# Patient Record
Sex: Female | Born: 1945 | Race: White | Hispanic: No | Marital: Married | Smoking: Never smoker
Health system: Southern US, Community
[De-identification: ages and names within clinical notes are randomized; demographics above are authoritative.]

## PROBLEM LIST (undated history)

## (undated) DIAGNOSIS — K635 Polyp of colon: Secondary | ICD-10-CM

## (undated) DIAGNOSIS — I8390 Asymptomatic varicose veins of unspecified lower extremity: Secondary | ICD-10-CM

## (undated) DIAGNOSIS — E785 Hyperlipidemia, unspecified: Secondary | ICD-10-CM

## (undated) DIAGNOSIS — I10 Essential (primary) hypertension: Secondary | ICD-10-CM

## (undated) DIAGNOSIS — N6009 Solitary cyst of unspecified breast: Secondary | ICD-10-CM

## (undated) DIAGNOSIS — M8580 Other specified disorders of bone density and structure, unspecified site: Secondary | ICD-10-CM

## (undated) DIAGNOSIS — S4290XA Fracture of unspecified shoulder girdle, part unspecified, initial encounter for closed fracture: Secondary | ICD-10-CM

## (undated) HISTORY — PX: COLONOSCOPY: SHX174

## (undated) HISTORY — PX: SKIN GRAFT: SHX250

## (undated) HISTORY — DX: Polyp of colon: K63.5

## (undated) HISTORY — DX: Asymptomatic varicose veins of unspecified lower extremity: I83.90

## (undated) HISTORY — PX: OOPHORECTOMY: SHX86

## (undated) HISTORY — DX: Fracture of unspecified shoulder girdle, part unspecified, initial encounter for closed fracture: S42.90XA

## (undated) HISTORY — DX: Solitary cyst of unspecified breast: N60.09

## (undated) HISTORY — DX: Hyperlipidemia, unspecified: E78.5

## (undated) HISTORY — PX: OTHER SURGICAL HISTORY: SHX169

## (undated) HISTORY — DX: Essential (primary) hypertension: I10

## (undated) HISTORY — DX: Other specified disorders of bone density and structure, unspecified site: M85.80

---

## 1998-03-08 ENCOUNTER — Emergency Department (HOSPITAL_COMMUNITY): Admission: EM | Admit: 1998-03-08 | Discharge: 1998-03-08 | Payer: Self-pay | Admitting: Emergency Medicine

## 1998-12-10 ENCOUNTER — Encounter: Payer: Self-pay | Admitting: Obstetrics and Gynecology

## 1998-12-10 ENCOUNTER — Ambulatory Visit (HOSPITAL_COMMUNITY): Admission: RE | Admit: 1998-12-10 | Discharge: 1998-12-10 | Payer: Self-pay | Admitting: Obstetrics and Gynecology

## 1998-12-22 ENCOUNTER — Ambulatory Visit (HOSPITAL_COMMUNITY): Admission: RE | Admit: 1998-12-22 | Discharge: 1998-12-22 | Payer: Self-pay | Admitting: Obstetrics and Gynecology

## 1999-08-18 ENCOUNTER — Other Ambulatory Visit: Admission: RE | Admit: 1999-08-18 | Discharge: 1999-08-18 | Payer: Self-pay | Admitting: Obstetrics and Gynecology

## 2000-01-19 ENCOUNTER — Ambulatory Visit (HOSPITAL_COMMUNITY): Admission: RE | Admit: 2000-01-19 | Discharge: 2000-01-19 | Payer: Self-pay | Admitting: Internal Medicine

## 2000-01-19 ENCOUNTER — Encounter: Payer: Self-pay | Admitting: Internal Medicine

## 2000-09-01 ENCOUNTER — Other Ambulatory Visit: Admission: RE | Admit: 2000-09-01 | Discharge: 2000-09-01 | Payer: Self-pay | Admitting: Obstetrics and Gynecology

## 2001-06-15 ENCOUNTER — Ambulatory Visit (HOSPITAL_COMMUNITY): Admission: RE | Admit: 2001-06-15 | Discharge: 2001-06-15 | Payer: Self-pay | Admitting: Obstetrics and Gynecology

## 2001-06-15 ENCOUNTER — Encounter: Payer: Self-pay | Admitting: Obstetrics and Gynecology

## 2001-09-04 ENCOUNTER — Other Ambulatory Visit: Admission: RE | Admit: 2001-09-04 | Discharge: 2001-09-04 | Payer: Self-pay | Admitting: Obstetrics and Gynecology

## 2003-01-11 ENCOUNTER — Encounter: Payer: Self-pay | Admitting: Internal Medicine

## 2003-01-11 ENCOUNTER — Ambulatory Visit (HOSPITAL_COMMUNITY): Admission: RE | Admit: 2003-01-11 | Discharge: 2003-01-11 | Payer: Self-pay | Admitting: Internal Medicine

## 2004-10-20 ENCOUNTER — Encounter: Admission: RE | Admit: 2004-10-20 | Discharge: 2004-10-20 | Payer: Self-pay | Admitting: Internal Medicine

## 2006-04-13 ENCOUNTER — Encounter: Admission: RE | Admit: 2006-04-13 | Discharge: 2006-04-13 | Payer: Self-pay | Admitting: Internal Medicine

## 2007-10-27 ENCOUNTER — Ambulatory Visit: Payer: Self-pay | Admitting: Internal Medicine

## 2010-08-06 HISTORY — PX: CHOLECYSTECTOMY: SHX55

## 2010-08-20 ENCOUNTER — Encounter (INDEPENDENT_AMBULATORY_CARE_PROVIDER_SITE_OTHER): Payer: Self-pay | Admitting: General Surgery

## 2010-08-20 ENCOUNTER — Observation Stay (HOSPITAL_COMMUNITY)
Admission: RE | Admit: 2010-08-20 | Discharge: 2010-08-22 | Payer: Self-pay | Source: Home / Self Care | Attending: General Surgery | Admitting: General Surgery

## 2010-09-26 ENCOUNTER — Encounter: Payer: Self-pay | Admitting: Internal Medicine

## 2010-09-27 ENCOUNTER — Encounter: Payer: Self-pay | Admitting: Internal Medicine

## 2010-10-12 NOTE — Discharge Summary (Signed)
  NAMECERENITY, GOSHORN NO.:  [id number]  MEDICAL RECORD NO.:  [id number]          PATIENT TYPE:  OBV  LOCATION:  5158                         FACILITY:  [facility]  PHYSICIAN:  Adolph Pollack, M.D.DATE OF BIRTH:  July 24, 1946  DATE OF ADMISSION:  08/20/2010 DATE OF DISCHARGE:  08/22/2010                              DISCHARGE SUMMARY   PRIMARY FINAL DISCHARGE DIAGNOSIS:  Acute cholecystitis.  PROCEDURE:  Laparoscopic cholecystectomy with intraoperative cholangiogram on August 20, 2010.  REASON FOR ADMISSION:  Ms. Lapre is a 65 year old female who had severe case of biliary colic and right upper quadrant pain on [place] the beach.  A hospital there evaluated her an performed and abdominal ultrasound demonstrating multiple gallstones but no evidence of acute cholecystitis.  White blood cell count and liver function tests within normal limits.  She improved somewhat and then presented for elective cholecystectomy.  HOSPITAL COURSE:  Elective cholecystectomy demonstrated acute and chronic inflammatory changes that were fairly significant.  She tolerated the procedure well and a drain was left in.  I kept her in the hospital and on intravenous Unasyn.  She was feeling better. JP drain had serous fluid, and she was able to be discharged to home on August 22, 2010.  DISPOSITION:  Discharged to home on August 22, 2010.  She was told how to care for the drain and was given dietary and activity instructions. She will return to the office on August 26, 2010, for drain removal and followup.  Medications that were given on discharge were Percocet and Augmentin.  She was in satisfactory condition.     Adolph Pollack, M.D.     Kari Baars  D:  10/06/2010  T:  10/07/2010  Job:  [id number]  Electronically Signed by Avel Peace M.D. on 10/12/2010 02:37:39 PM

## 2010-11-16 LAB — CBC
MCH: 27.5 pg (ref 26.0–34.0)
MCHC: 32.1 g/dL (ref 30.0–36.0)
Platelets: 223 10*3/uL (ref 150–400)
RBC: 4.36 MIL/uL (ref 3.87–5.11)
RDW: 13.4 % (ref 11.5–15.5)

## 2010-11-17 LAB — CBC
Platelets: 313 10*3/uL (ref 150–400)
RDW: 13.1 % (ref 11.5–15.5)
WBC: 5.3 10*3/uL (ref 4.0–10.5)

## 2010-11-17 LAB — SURGICAL PCR SCREEN
MRSA, PCR: NEGATIVE
Staphylococcus aureus: NEGATIVE

## 2011-01-20 ENCOUNTER — Other Ambulatory Visit: Payer: Self-pay | Admitting: Internal Medicine

## 2011-01-20 DIAGNOSIS — I872 Venous insufficiency (chronic) (peripheral): Secondary | ICD-10-CM

## 2011-02-03 ENCOUNTER — Encounter: Payer: Self-pay | Admitting: Vascular Surgery

## 2011-02-12 ENCOUNTER — Other Ambulatory Visit: Payer: Self-pay

## 2011-02-23 ENCOUNTER — Encounter (INDEPENDENT_AMBULATORY_CARE_PROVIDER_SITE_OTHER): Payer: 59 | Admitting: Vascular Surgery

## 2011-02-23 ENCOUNTER — Encounter (INDEPENDENT_AMBULATORY_CARE_PROVIDER_SITE_OTHER): Payer: 59

## 2011-02-23 DIAGNOSIS — I83893 Varicose veins of bilateral lower extremities with other complications: Secondary | ICD-10-CM

## 2011-02-23 DIAGNOSIS — R609 Edema, unspecified: Secondary | ICD-10-CM

## 2011-02-24 NOTE — Consult Note (Signed)
NEW PATIENT CONSULTATION  Tracy Hawkins, Tracy Hawkins                                       02/23/2011 ZOXWR#:[id number]  The patient presents today for evaluation of lower extremity venous hypertension.  She is a very pleasant, active 65 year old white female with progressive symptoms in both lower extremities.  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  She has bilateral lower extremity swelling.  She reports an aching sensation and occasionally has awakening from sleep related to the pain there, as well.  She does not have any history of DVT, hemorrhage or superficial thrombophlebitis.  She does elevate the legs when possible.  She has not worn compression garments.  PAST MEDICAL HISTORY:  Remarkable for very good health.  She does have a history of cholecystectomy in December 2011.  No other major medical difficulties.  SOCIAL HISTORY:  She is married with 2 children.  She works as a Advice worker.  She does not smoke or drink alcohol.  FAMILY HISTORY:  Negative for premature atherosclerotic disease.  REVIEW OF SYSTEMS:  Positive for weight gain.  Her height is 5 feet 7 inches tall. VASCULAR:  Positive for pain with prolonged standing and with occasionally lying. NEUROLOGIC:  Positive for occasional headaches. Otherwise, review of systems negative.  PHYSICAL EXAMINATION:  General:  Well-developed, well-nourished white female appearing her stated age in no acute stress.  Vital Signs:  Blood pressure 149/85, pulse 69, respirations 16.  HEENT:  Normal.  UPPER EXTREMITIES:  Her radial and dorsalis pedis pulse are 2+ bilaterally. MUSCULOSKELETAL:  Shows no major deformities or cyanosis.  NEUROLOGIC: No focal weakness or paresthesias.  SKIN:  Without ulcers or rashes. LOWER EXTREMITIES:  She does have marked varicosities in her medial calves bilaterally.  She  underwent noninvasive venous duplex in our office, and I have imaged her veins as well.  This shows gross reflux throughout her great saphenous veins bilaterally extending into the varicosities.  She has reflux only in the proximal common femoral vein laterally.  I discussed the significance of this with the patient.  I explained that it would be safe to leave these as they are.  She is having progressive symptoms related to this.  I did explain the options of staged bilateral laser ablation and stab phlebectomy for relief of her symptoms.  I have recommended we fit her with graduated compression stockings and have done this today and instructed her on the daily use of these.  We will see her again in 3 months to continue discussion and determine her level of improvement with conservative treatment.    Larina Earthly, M.D. Electronically Signed  TFE/MEDQ  D:  02/23/2011  T:  02/24/2011  Job:  [id number]  cc:   Geoffry Paradise, M.D.

## 2011-02-26 ENCOUNTER — Other Ambulatory Visit: Payer: Self-pay | Admitting: Internal Medicine

## 2011-03-08 NOTE — Procedures (Unsigned)
LOWER EXTREMITY VENOUS REFLUX EXAM  INDICATION:  Edema, varicose veins.  EXAM:  Using color-flow imaging and pulse Doppler spectral analysis, the bilateral common femoral, superficial femoral, popliteal, posterior tibial, greater and lesser saphenous veins are evaluated.  There is evidence suggesting deep venous insufficiency in the bilateral lower extremities.  The bilateral saphenofemoral junctions are not competent with reflux of >527milliseconds. The bilateral GSV's are not competent with reflux of >572milliseconds with the caliber as described below.  The bilateral proximal small saphenous veins demonstrate incompetency. The right small saphenous vein diameters range from 0.26 cm to 0.33 cm. The left small saphenous vein diameters range from 0.21 cm to 0.44 cm.  GSV Diameter (used if found to be incompetent only)                                           Right    Left Proximal Greater Saphenous Vein           1.11 cm  1.34 cm Proximal-to-mid-thigh                     0.92 cm  1.24 cm Mid thigh                                 0.85 cm  1.51 cm Mid-distal thigh                          cm       cm Distal thigh                              0.87 cm  0.79 cm Knee                                      0.79 cm  0.74 cm  IMPRESSION: 1. The bilateral great saphenous veins are not competent with reflux     >548milliseconds. 2. The bilateral great saphenous veins are not tortuous. 3. The deep venous systems of the bilateral lower extremities are not     competent with reflux of >568milliseconds. 4. The bilateral small saphenous veins are not competent with reflux     of >560milliseconds. 5. An incompetent perforator is identified in the left mid /distal     calf, measuring approximately 0.23 cm.     ___________________________________________ Larina Earthly, M.D.  SH/MEDQ  D:  02/23/2011  T:  02/23/2011  Job:  [id number]

## 2011-04-15 ENCOUNTER — Other Ambulatory Visit: Payer: Self-pay

## 2011-04-27 ENCOUNTER — Encounter: Payer: Self-pay | Admitting: Vascular Surgery

## 2011-04-29 ENCOUNTER — Encounter: Payer: Self-pay | Admitting: Vascular Surgery

## 2011-05-05 ENCOUNTER — Other Ambulatory Visit: Payer: Self-pay | Admitting: Dermatology

## 2011-05-06 ENCOUNTER — Ambulatory Visit
Admission: RE | Admit: 2011-05-06 | Discharge: 2011-05-06 | Disposition: A | Discharge status: Home / Self Care | Payer: 59 | Source: Ambulatory Visit | Attending: Internal Medicine | Admitting: Internal Medicine

## 2011-05-06 DIAGNOSIS — I872 Venous insufficiency (chronic) (peripheral): Secondary | ICD-10-CM

## 2011-05-25 ENCOUNTER — Encounter: Payer: Self-pay | Admitting: Vascular Surgery

## 2011-05-26 ENCOUNTER — Ambulatory Visit (INDEPENDENT_AMBULATORY_CARE_PROVIDER_SITE_OTHER): Payer: 59 | Admitting: Vascular Surgery

## 2011-05-26 ENCOUNTER — Encounter: Payer: Self-pay | Admitting: Vascular Surgery

## 2011-05-26 VITALS — BP 146/72 | HR 89 | Resp 18 | Ht 66.0 in | Wt 200.0 lb

## 2011-05-26 DIAGNOSIS — I83893 Varicose veins of bilateral lower extremities with other complications: Secondary | ICD-10-CM

## 2011-05-26 NOTE — Progress Notes (Signed)
Problems with Activities of Daily Living Secondary to Leg Pain  1. Mrs. Billinger states she has greatly reduced the length of time she walks on the treadmill for exercise due to leg pain.   2. Mrs. Millirons states that prolonged sitting especially on car trips is extremely painful due to leg pain.  Rankin, [name]   Failure of  Conservative Therapy:  1. Worn 20-30 mm Hg thigh high compression hose >3 months with no relief of symptoms.  2. Frequently elevates legs-no relief of symptoms  3. Taken Ibuprofen 600 Mg TID with no relief of symptoms.  The patient is a 14 discussion regarding severe venous hypertension and pain associated with bilateral calf vein varicosities. She has failed a 3 month trial of elevation ibuprofen and thigh graduated compression garments. He reports pain specifically over the varicosities in the medial calves and also a heavy achy sensation at prolonged standing and both lower extremities. She's not had any DVT or bleeding.  I did reimage her veins with site ultrasound sister gross reflux in her saphenous veins extending into the varicosities.  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 We will plan for staged bilateral laser ablation of her saphenous veins and staged bilateral stab phlebectomy at the same setting

## 2011-06-03 ENCOUNTER — Ambulatory Visit (AMBULATORY_SURGERY_CENTER): Payer: 59 | Admitting: *Deleted

## 2011-06-03 ENCOUNTER — Encounter: Payer: Self-pay | Admitting: Internal Medicine

## 2011-06-03 ENCOUNTER — Telehealth: Payer: Self-pay | Admitting: *Deleted

## 2011-06-03 VITALS — Ht 66.0 in | Wt 215.5 lb

## 2011-06-03 DIAGNOSIS — Z1211 Encounter for screening for malignant neoplasm of colon: Secondary | ICD-10-CM

## 2011-06-03 MED ORDER — PEG-KCL-NACL-NASULF-NA ASC-C 100 G PO SOLR: ORAL | Status: DC

## 2011-06-03 NOTE — Telephone Encounter (Signed)
Ok for recall now (she's only months away from 10 yrs). Thanks

## 2011-06-03 NOTE — Telephone Encounter (Signed)
Dr. Perry---pt had screening colonoscopy 2003. She had hyperplastic polyps.  No family hx of colon cancer or polyps.   She had laparoscopic cholecystectomy December 2011. She is here today in [place] for recall colonoscopy. She is having bloating, belching, and flatulence since that surgery.  No other GI symptoms.  Her 10 year recall would be August 2013.  Is she due for colon now or should she have recall colonoscopy Aug. 2013?  I have put her chart on your desk for review.  Thanks, Olegario Messier

## 2011-06-03 NOTE — Telephone Encounter (Signed)
Pt notified to keep colonoscopy appt as planned. Tracy Hawkins

## 2011-06-15 ENCOUNTER — Ambulatory Visit (AMBULATORY_SURGERY_CENTER): Payer: 59 | Admitting: Internal Medicine

## 2011-06-15 ENCOUNTER — Encounter: Payer: Self-pay | Admitting: Internal Medicine

## 2011-06-15 VITALS — BP 137/76 | HR 81 | Temp 98.4°F | Resp 14 | Ht 66.0 in | Wt 215.0 lb

## 2011-06-15 DIAGNOSIS — Z1211 Encounter for screening for malignant neoplasm of colon: Secondary | ICD-10-CM

## 2011-06-15 DIAGNOSIS — D126 Benign neoplasm of colon, unspecified: Secondary | ICD-10-CM

## 2011-06-15 DIAGNOSIS — Z86010 Personal history of colonic polyps: Secondary | ICD-10-CM

## 2011-06-15 MED ORDER — SODIUM CHLORIDE 0.9 % IV SOLN: 500.0000 mL | INTRAVENOUS | Status: DC

## 2011-06-15 NOTE — Progress Notes (Signed)
Report received from [facility] RN to relieve her for lunch

## 2011-06-15 NOTE — Patient Instructions (Signed)
Please review discharge instructions (blue and green sheets)  Await pathology  Resume your normal medications 

## 2011-06-16 ENCOUNTER — Telehealth: Payer: Self-pay

## 2011-06-16 NOTE — Telephone Encounter (Signed)

## 2011-07-02 ENCOUNTER — Other Ambulatory Visit: Payer: Self-pay | Admitting: Internal Medicine

## 2011-12-28 ENCOUNTER — Telehealth: Payer: Self-pay | Admitting: *Deleted

## 2011-12-28 NOTE — Telephone Encounter (Signed)
Returned Mrs. Mancinas's earlier phone message with questions regarding process for rescheduling laser ablation procedure she deferred in October 2012.  Scheduled Mrs. Durrell for a follow-up appointment with Dr. Arbie Cookey on 01-04-2012 at 10AM.  Mrs. Tatum states she has been wearing thigh high 20-30 mm HG compression stockings since her last visit with Dr. Arbie Cookey 05-26-2011 and is continuing to experience leg pain.  Explained to Mrs. Brod that following the follow up visit with Dr. Arbie Cookey on 01-04-2012 we would pre-cert for office surgery with her insurance company. Mrs. Meinecke verbalized understanding of the process.  Kyia Rhude, [name]

## 2012-01-03 ENCOUNTER — Encounter: Payer: Self-pay | Admitting: Vascular Surgery

## 2012-01-04 ENCOUNTER — Encounter: Payer: Self-pay | Admitting: Vascular Surgery

## 2012-01-04 ENCOUNTER — Ambulatory Visit (INDEPENDENT_AMBULATORY_CARE_PROVIDER_SITE_OTHER): Payer: 59 | Admitting: Vascular Surgery

## 2012-01-04 VITALS — BP 138/84 | HR 67 | Resp 18 | Ht 67.0 in | Wt 200.0 lb

## 2012-01-04 DIAGNOSIS — I83893 Varicose veins of bilateral lower extremities with other complications: Secondary | ICD-10-CM | POA: Insufficient documentation

## 2012-01-04 NOTE — Progress Notes (Signed)
Problems with Activities of Daily Living Secondary to Leg Pain  1. 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  2. Tracy Hawkins walks for exercise on treadmill and she has had to decrease the frequency of walking due to leg pain.  3. 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  Rankin, [name]   Failure of  Conservative Therapy:  1. Worn 20-30 mm Hg thigh high compression hose >3 months with no relief of symptoms.  2. Frequently elevates legs-no relief of symptoms  3. Taken Ibuprofen 600 Mg TID with no relief of symptoms.  Patient tends to have difficulty with venous hypertension in both legs. She continues to wear thigh high compression garments and continues to have pain despite this. She is quite active and has pain with sitting and with standing. His also makes it difficult for her to sleep at night. I reimaged her veins with ultrasound today. This reveals gross reflux in her great saphenous vein bilaterally extending into the large tributaries in her medial calves bilaterally. I have recommended staged bilateral laser ablation and stab phlebectomy for relief of her pain. I reviewed the procedure with her and she wished to proceed as soon as possible. We will schedule this at her convenience. She reports that the right leg is somewhat worse than the left so we will start with her right leg first

## 2012-01-10 ENCOUNTER — Other Ambulatory Visit: Payer: Self-pay | Admitting: *Deleted

## 2012-01-10 DIAGNOSIS — I83893 Varicose veins of bilateral lower extremities with other complications: Secondary | ICD-10-CM

## 2012-02-23 ENCOUNTER — Telehealth: Payer: Self-pay | Admitting: *Deleted

## 2012-02-23 NOTE — Telephone Encounter (Signed)
Informed Mrs. Wimer that since Dr. Bosie Helper schedule had changed and he would be in the office the week of July 22,2013 her post laser ablation duplex and follow up appointments would rescheduled to March 30, 2012 at 9:00AM for duplex and 10:15AM to see Dr. Arbie Cookey.  Sherell Christoffel, [name]

## 2012-03-22 ENCOUNTER — Encounter: Payer: Self-pay | Admitting: Vascular Surgery

## 2012-03-23 ENCOUNTER — Ambulatory Visit (INDEPENDENT_AMBULATORY_CARE_PROVIDER_SITE_OTHER): Payer: 59 | Admitting: Vascular Surgery

## 2012-03-23 ENCOUNTER — Encounter: Payer: Self-pay | Admitting: Vascular Surgery

## 2012-03-23 ENCOUNTER — Other Ambulatory Visit: Payer: 59 | Admitting: Vascular Surgery

## 2012-03-23 VITALS — BP 130/81 | HR 69 | Resp 18 | Ht 66.0 in | Wt 201.0 lb

## 2012-03-23 DIAGNOSIS — I83893 Varicose veins of bilateral lower extremities with other complications: Secondary | ICD-10-CM

## 2012-03-23 NOTE — Progress Notes (Signed)
Laser Ablation Procedure      Date: 03/23/2012    Tracy Hawkins DOB:06/26/1946  Consent signed: Yes  Surgeon:T.F. Early  Procedure: Laser Ablation: right Greater Saphenous Vein  BP 130/81  Pulse 69  Resp 18  Ht 5\' 6"  (1.676 m)  Wt 201 lb (91.173 kg)  BMI 32.44 kg/m2  Start time: 8:30AM   End time: 10:00AM  Tumescent Anesthesia: 475 cc 0.9% NaCl with 50 cc Lidocaine HCL with 1% Epi and 15 cc 8.4% NaHCO3  Local Anesthesia: 2 cc Lidocaine HCL and NaHCO3 (ratio 2:1)  Continuous Mode: 15 Watts Total Energy 2488 Joules Total Time2:45     Stab Phlebectomy: 10-20 incisions right calf Sites: Calf  Patient tolerated procedure well: Yes  Rankin, [name]  Description of Procedure:  After marking the course of the saphenous vein and the secondary varicosities in the standing position, the patient was placed on the operating table in the supine position, and the right leg was prepped and draped in sterile fashion. Local anesthetic was administered, and under ultrasound guidance the saphenous vein was accessed with a micro needle and guide wire; then the micro puncture sheath was placed. A guide wire was inserted to the saphenofemoral junction, followed by a 5 french sheath.  The position of the sheath and then the laser fiber below the junction was confirmed using the ultrasound and visualization of the aiming beam.  Tumescent anesthesia was administered along the course of the saphenous vein using ultrasound guidance. Protective laser glasses were placed on the patient, and the laser was fired at at 15 watt continuous mode.  For a total of 2488 joules.  A steri strip was applied to the puncture site.  The patient was then put into Trendelenburg position.  Local anesthetic was utilized overlying the marked varicosities.  Greater than 10-20 stab wounds were made using the tip of an 11 blade; and using the vein hook,  The phlebectomies were performed using a hemostat to avulse these  varicosities.  Adequate hemostasis was achieved, and steri strips were applied to the stab wound.      ABD pads and thigh high compression stockings were applied.  Ace wrap bandages were applied over the phlebectomy sites and at the top of the saphenofemoral junction.  Blood loss was less than 15 cc.  The patient ambulated out of the operating room having tolerated the procedure well.  The patient underwent uneventful right laser ablation from just below the knee to just below the saphenofemoral junction. She also had the multiple stabs in the calf. She had no immediate complication be seen again in one week

## 2012-03-24 ENCOUNTER — Encounter: Payer: Self-pay | Admitting: Vascular Surgery

## 2012-03-27 ENCOUNTER — Telehealth: Payer: Self-pay | Admitting: *Deleted

## 2012-03-27 NOTE — Telephone Encounter (Signed)
03/27/2012  Time: 9:14 AM   Patient Name: Tracy Hawkins  Patient of: T.F. Early  Procedure:Laser Ablation right greater saphenous vein and stab phlebectomy 10-20 incisions right leg  Reached patient at home and checked  Her status  Yes    Comments/Actions Taken: Mrs. Kuras states no problems with bleeding/oozing or swelling. She states mild discomfort in right inner thigh near groin which is relieved by Ibuprofen. Reviewed postprocedural instructions with her and reminded her of venous duplex and follow up appointment with Dr. Arbie Cookey on 03-30-2012.    Roshon Duell, [name] @SIGNATURE @

## 2012-03-28 ENCOUNTER — Ambulatory Visit: Payer: 59 | Admitting: Vascular Surgery

## 2012-03-29 ENCOUNTER — Encounter: Payer: Self-pay | Admitting: Vascular Surgery

## 2012-03-30 ENCOUNTER — Encounter (INDEPENDENT_AMBULATORY_CARE_PROVIDER_SITE_OTHER): Payer: 59 | Admitting: *Deleted

## 2012-03-30 ENCOUNTER — Other Ambulatory Visit: Payer: Self-pay | Admitting: *Deleted

## 2012-03-30 ENCOUNTER — Ambulatory Visit: Payer: 59 | Admitting: Vascular Surgery

## 2012-03-30 DIAGNOSIS — Z48812 Encounter for surgical aftercare following surgery on the circulatory system: Secondary | ICD-10-CM

## 2012-03-30 DIAGNOSIS — I83893 Varicose veins of bilateral lower extremities with other complications: Secondary | ICD-10-CM

## 2012-04-11 NOTE — Procedures (Unsigned)
DUPLEX DEEP VENOUS EXAM - LOWER EXTREMITY  INDICATION:  One-week follow-up, right great saphenous vein ablation.  HISTORY:  Edema:  No. Trauma/Surgery:  Ablation. Pain:  Tenderness. PE:  No. Previous DVT:  No. Anticoagulants:  No. Other:  DUPLEX EXAM:               CFV   SFV   PopV  PTV    GSV               R  L  R  L  R  L  R   L  R  L Thrombosis    0           0            + Spontaneous   +           +            0 Phasic Augmentation  +           +            0 Compressible  +           +            0 Competent  Legend:  + - yes  o - no  p - partial  D - decreased  IMPRESSION:  Successful ablation of right great saphenous vein approximately 1 cm distal to the junction through the insertion point without common femoral vein involvement.   _____________________________ Larina Earthly, M.D.  LT/MEDQ  D:  03/30/2012  T:  03/30/2012  Job:  [id number]

## 2012-04-19 IMAGING — RF DG CHOLANGIOGRAM OPERATIVE
1 series · 4 of 4 positions shown · non-contrast
Comparison: None.

CLINICAL DATA: Symptomatic cholelithiasis

INTRAOPERATIVE CHOLANGIOGRAM
TECHNIQUE: Cholangiographic images from the C-arm fluoroscopic
device were submitted for interpretation post-operatively.  Please
see the procedural report for the amount of contrast and the
fluoroscopy time utilized.

[Series 1: run · 4 of 30 frames shown]
[frame 1/30]
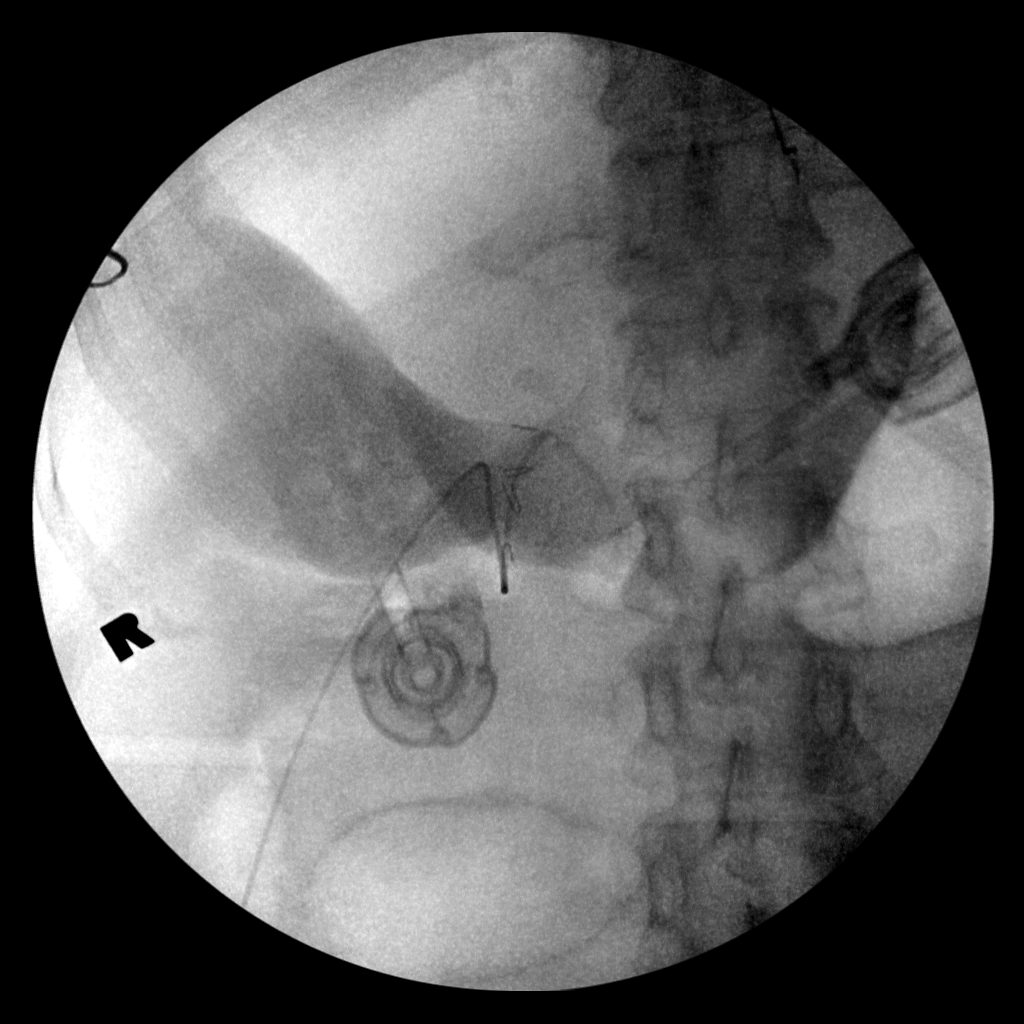
[frame 5/30]
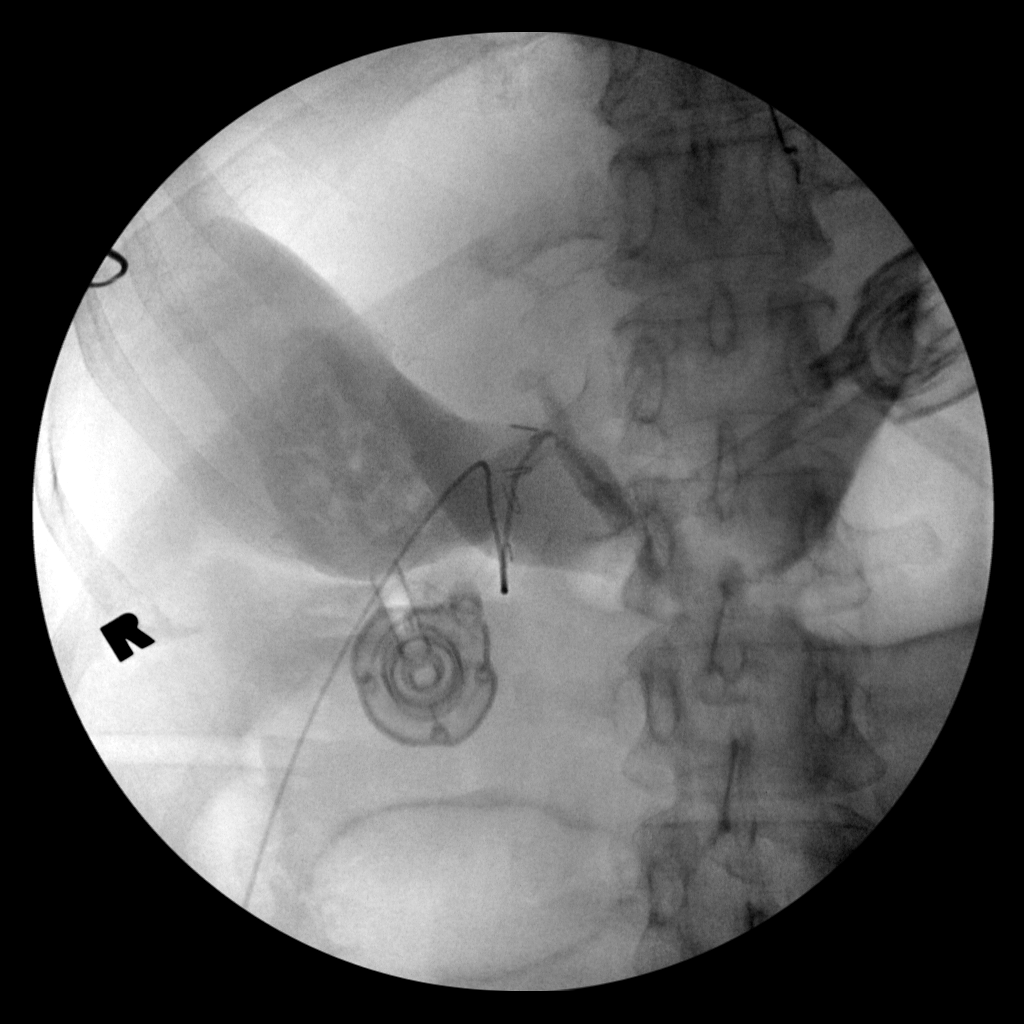
[frame 16/30]
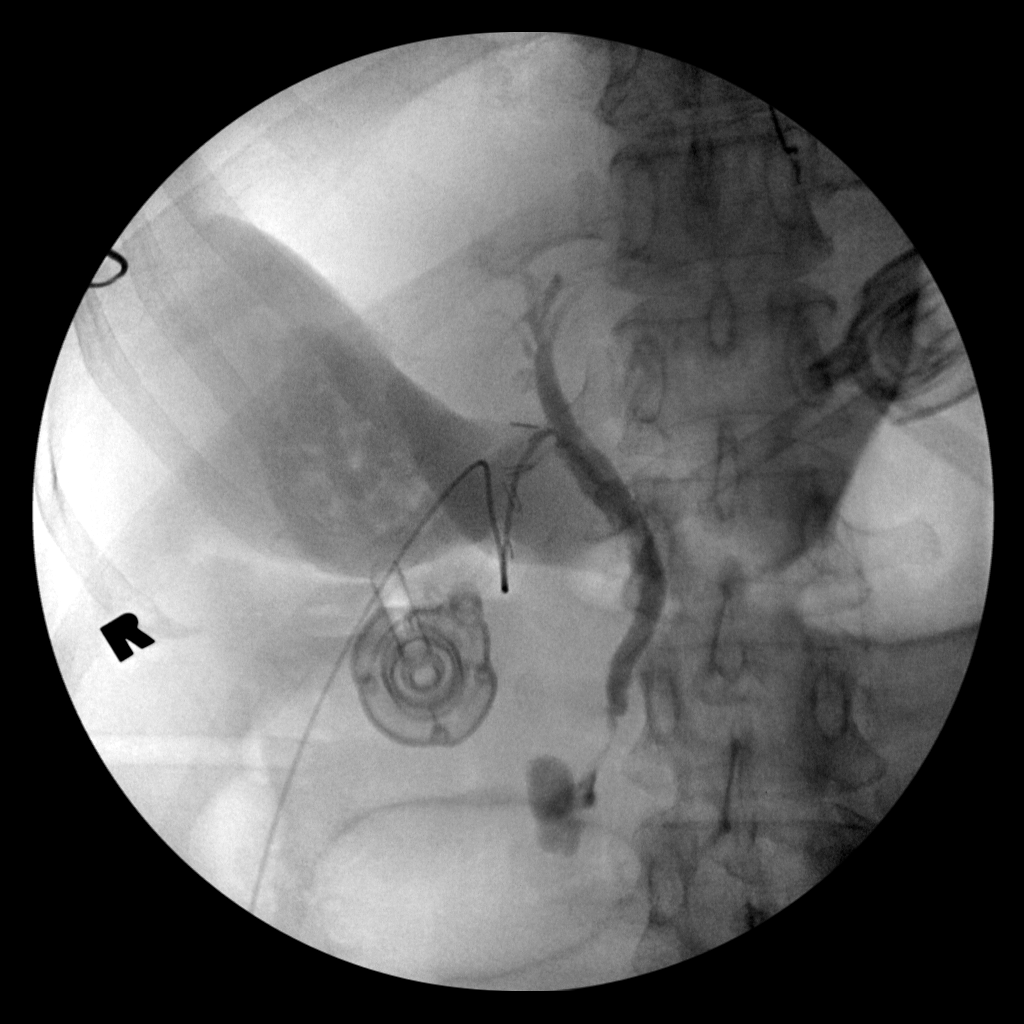
[frame 26/30]
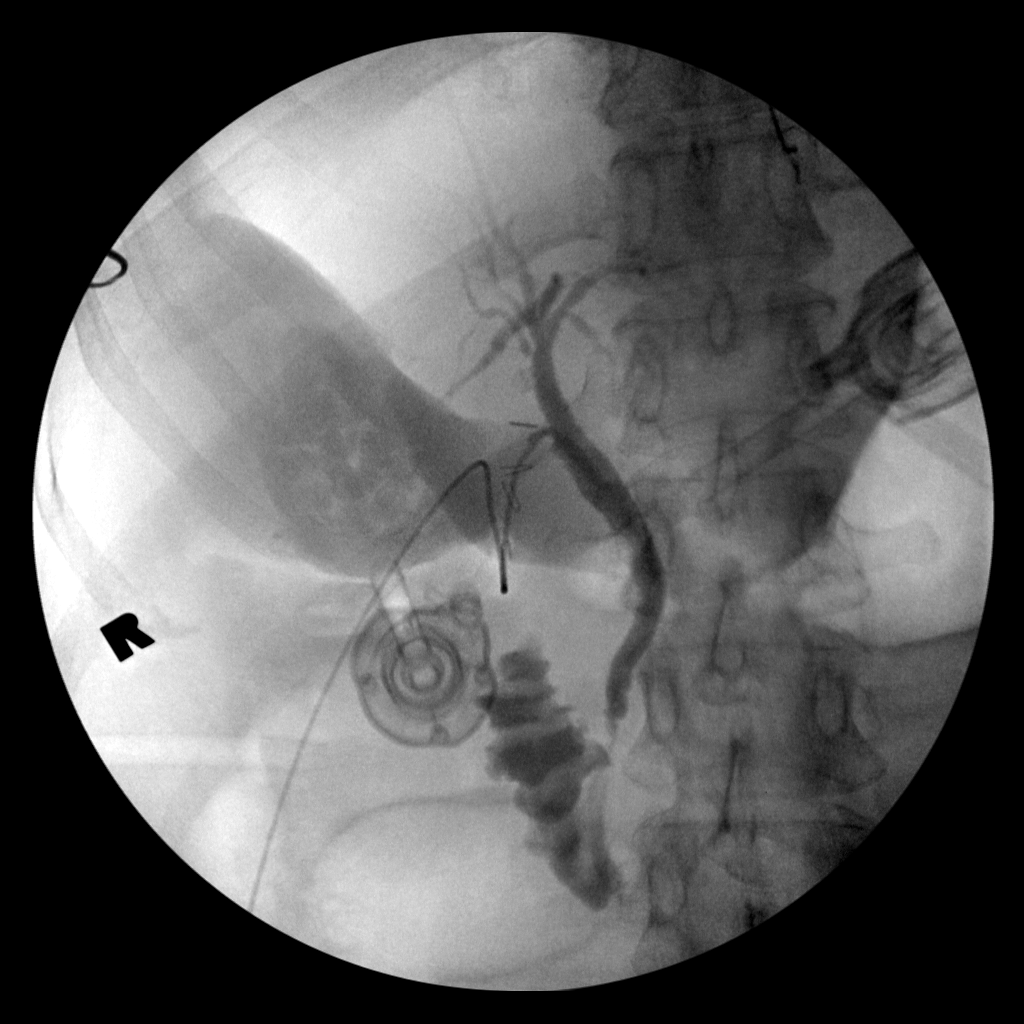

[4 of 4 positions shown; findings below may reference images not displayed]

FINDINGS: Initial image demonstrates cannulation of the cystic
duct.  Multiple gallstones seen within the gallbladder.  Injection
of contrast fills the common bile duct, common hepatic duct, and
central intrahepatic ducts.  There is no evidence of retained stone
in the duct.  Contrast flows into the duodenum.
IMPRESSION: No evidence of retained stones in the common bile duct.

## 2012-04-27 ENCOUNTER — Other Ambulatory Visit: Payer: 59 | Admitting: Vascular Surgery

## 2012-05-04 ENCOUNTER — Other Ambulatory Visit: Payer: 59 | Admitting: Vascular Surgery

## 2012-05-04 ENCOUNTER — Ambulatory Visit: Payer: 59 | Admitting: Vascular Surgery

## 2012-05-11 ENCOUNTER — Ambulatory Visit: Payer: 59 | Admitting: Vascular Surgery

## 2012-06-01 ENCOUNTER — Other Ambulatory Visit: Payer: 59 | Admitting: Vascular Surgery

## 2012-06-06 ENCOUNTER — Ambulatory Visit: Payer: 59 | Admitting: Vascular Surgery

## 2012-06-08 ENCOUNTER — Ambulatory Visit: Payer: 59 | Admitting: Vascular Surgery

## 2013-03-28 ENCOUNTER — Other Ambulatory Visit: Payer: Self-pay | Admitting: Dermatology

## 2014-02-20 DIAGNOSIS — H40019 Open angle with borderline findings, low risk, unspecified eye: Secondary | ICD-10-CM | POA: Diagnosis not present

## 2014-04-11 ENCOUNTER — Other Ambulatory Visit: Payer: Self-pay | Admitting: Dermatology

## 2014-04-11 DIAGNOSIS — L821 Other seborrheic keratosis: Secondary | ICD-10-CM | POA: Diagnosis not present

## 2014-04-11 DIAGNOSIS — L570 Actinic keratosis: Secondary | ICD-10-CM | POA: Diagnosis not present

## 2014-04-11 DIAGNOSIS — Z85828 Personal history of other malignant neoplasm of skin: Secondary | ICD-10-CM | POA: Diagnosis not present

## 2014-04-11 DIAGNOSIS — D1801 Hemangioma of skin and subcutaneous tissue: Secondary | ICD-10-CM | POA: Diagnosis not present

## 2014-04-11 DIAGNOSIS — D485 Neoplasm of uncertain behavior of skin: Secondary | ICD-10-CM | POA: Diagnosis not present

## 2014-04-11 DIAGNOSIS — L819 Disorder of pigmentation, unspecified: Secondary | ICD-10-CM | POA: Diagnosis not present

## 2014-04-11 DIAGNOSIS — D239 Other benign neoplasm of skin, unspecified: Secondary | ICD-10-CM | POA: Diagnosis not present

## 2014-04-12 ENCOUNTER — Encounter: Payer: Self-pay | Admitting: Internal Medicine

## 2014-04-23 DIAGNOSIS — J321 Chronic frontal sinusitis: Secondary | ICD-10-CM | POA: Diagnosis not present

## 2014-04-23 DIAGNOSIS — R05 Cough: Secondary | ICD-10-CM | POA: Diagnosis not present

## 2014-04-23 DIAGNOSIS — R059 Cough, unspecified: Secondary | ICD-10-CM | POA: Diagnosis not present

## 2014-04-23 DIAGNOSIS — I10 Essential (primary) hypertension: Secondary | ICD-10-CM | POA: Diagnosis not present

## 2014-04-23 DIAGNOSIS — Z6833 Body mass index (BMI) 33.0-33.9, adult: Secondary | ICD-10-CM | POA: Diagnosis not present

## 2014-06-05 DIAGNOSIS — H25019 Cortical age-related cataract, unspecified eye: Secondary | ICD-10-CM | POA: Diagnosis not present

## 2014-06-05 DIAGNOSIS — H40019 Open angle with borderline findings, low risk, unspecified eye: Secondary | ICD-10-CM | POA: Diagnosis not present

## 2014-06-05 DIAGNOSIS — H04129 Dry eye syndrome of unspecified lacrimal gland: Secondary | ICD-10-CM | POA: Diagnosis not present

## 2014-06-05 DIAGNOSIS — H2510 Age-related nuclear cataract, unspecified eye: Secondary | ICD-10-CM | POA: Diagnosis not present

## 2014-06-14 DIAGNOSIS — Z23 Encounter for immunization: Secondary | ICD-10-CM | POA: Diagnosis not present

## 2014-07-31 DIAGNOSIS — Z1231 Encounter for screening mammogram for malignant neoplasm of breast: Secondary | ICD-10-CM | POA: Diagnosis not present

## 2014-08-06 DIAGNOSIS — Z124 Encounter for screening for malignant neoplasm of cervix: Secondary | ICD-10-CM | POA: Diagnosis not present

## 2014-08-06 DIAGNOSIS — Z0000 Encounter for general adult medical examination without abnormal findings: Secondary | ICD-10-CM | POA: Diagnosis not present

## 2014-08-06 DIAGNOSIS — N841 Polyp of cervix uteri: Secondary | ICD-10-CM | POA: Diagnosis not present

## 2014-08-06 DIAGNOSIS — Z01419 Encounter for gynecological examination (general) (routine) without abnormal findings: Secondary | ICD-10-CM | POA: Diagnosis not present

## 2014-08-06 DIAGNOSIS — R87610 Atypical squamous cells of undetermined significance on cytologic smear of cervix (ASC-US): Secondary | ICD-10-CM | POA: Diagnosis not present

## 2015-04-07 DIAGNOSIS — H5711 Ocular pain, right eye: Secondary | ICD-10-CM | POA: Diagnosis not present

## 2015-04-07 DIAGNOSIS — I10 Essential (primary) hypertension: Secondary | ICD-10-CM | POA: Diagnosis not present

## 2015-06-06 DIAGNOSIS — D1801 Hemangioma of skin and subcutaneous tissue: Secondary | ICD-10-CM | POA: Diagnosis not present

## 2015-06-06 DIAGNOSIS — Z85828 Personal history of other malignant neoplasm of skin: Secondary | ICD-10-CM | POA: Diagnosis not present

## 2015-06-06 DIAGNOSIS — L821 Other seborrheic keratosis: Secondary | ICD-10-CM | POA: Diagnosis not present

## 2015-06-06 DIAGNOSIS — D2271 Melanocytic nevi of right lower limb, including hip: Secondary | ICD-10-CM | POA: Diagnosis not present

## 2015-06-06 DIAGNOSIS — D225 Melanocytic nevi of trunk: Secondary | ICD-10-CM | POA: Diagnosis not present

## 2015-06-06 DIAGNOSIS — L298 Other pruritus: Secondary | ICD-10-CM | POA: Diagnosis not present

## 2015-06-06 DIAGNOSIS — L814 Other melanin hyperpigmentation: Secondary | ICD-10-CM | POA: Diagnosis not present

## 2015-06-06 DIAGNOSIS — D2272 Melanocytic nevi of left lower limb, including hip: Secondary | ICD-10-CM | POA: Diagnosis not present

## 2015-06-06 DIAGNOSIS — L570 Actinic keratosis: Secondary | ICD-10-CM | POA: Diagnosis not present

## 2015-06-07 DIAGNOSIS — Z23 Encounter for immunization: Secondary | ICD-10-CM | POA: Diagnosis not present

## 2015-06-25 DIAGNOSIS — H25013 Cortical age-related cataract, bilateral: Secondary | ICD-10-CM | POA: Diagnosis not present

## 2015-06-25 DIAGNOSIS — H2513 Age-related nuclear cataract, bilateral: Secondary | ICD-10-CM | POA: Diagnosis not present

## 2015-06-25 DIAGNOSIS — H40013 Open angle with borderline findings, low risk, bilateral: Secondary | ICD-10-CM | POA: Diagnosis not present

## 2015-06-25 DIAGNOSIS — H04123 Dry eye syndrome of bilateral lacrimal glands: Secondary | ICD-10-CM | POA: Diagnosis not present

## 2015-07-24 ENCOUNTER — Ambulatory Visit (HOSPITAL_COMMUNITY)
Admission: RE | Admit: 2015-07-24 | Discharge: 2015-07-24 | Disposition: A | Discharge status: Home / Self Care | Payer: Medicare Other | Source: Ambulatory Visit | Attending: Internal Medicine | Admitting: Internal Medicine

## 2015-07-24 ENCOUNTER — Other Ambulatory Visit (HOSPITAL_COMMUNITY): Payer: Self-pay | Admitting: Internal Medicine

## 2015-07-24 DIAGNOSIS — M79662 Pain in left lower leg: Secondary | ICD-10-CM | POA: Diagnosis not present

## 2015-07-24 DIAGNOSIS — M859 Disorder of bone density and structure, unspecified: Secondary | ICD-10-CM | POA: Diagnosis not present

## 2015-07-24 DIAGNOSIS — M7989 Other specified soft tissue disorders: Secondary | ICD-10-CM | POA: Diagnosis not present

## 2015-07-24 DIAGNOSIS — I10 Essential (primary) hypertension: Secondary | ICD-10-CM | POA: Diagnosis not present

## 2015-07-24 DIAGNOSIS — M79605 Pain in left leg: Secondary | ICD-10-CM | POA: Diagnosis not present

## 2015-07-24 DIAGNOSIS — R52 Pain, unspecified: Secondary | ICD-10-CM

## 2015-07-24 DIAGNOSIS — E785 Hyperlipidemia, unspecified: Secondary | ICD-10-CM | POA: Diagnosis not present

## 2015-07-24 NOTE — Progress Notes (Signed)
Preliminary results by tech - Left Lower Ext. Venous Duplex Completed. Negative for deep and superficial vein thrombosis in the left lower extremity. Sigourney Portillo, BS, RDMS, RVT  

## 2015-08-07 DIAGNOSIS — Z1231 Encounter for screening mammogram for malignant neoplasm of breast: Secondary | ICD-10-CM | POA: Diagnosis not present

## 2015-08-11 DIAGNOSIS — Z01419 Encounter for gynecological examination (general) (routine) without abnormal findings: Secondary | ICD-10-CM | POA: Diagnosis not present

## 2015-08-11 DIAGNOSIS — Z1389 Encounter for screening for other disorder: Secondary | ICD-10-CM | POA: Diagnosis not present

## 2015-08-11 DIAGNOSIS — Z130 Encounter for screening for diseases of the blood and blood-forming organs and certain disorders involving the immune mechanism: Secondary | ICD-10-CM | POA: Diagnosis not present

## 2015-08-11 DIAGNOSIS — Z124 Encounter for screening for malignant neoplasm of cervix: Secondary | ICD-10-CM | POA: Diagnosis not present

## 2015-08-25 DIAGNOSIS — R922 Inconclusive mammogram: Secondary | ICD-10-CM | POA: Diagnosis not present

## 2015-08-25 DIAGNOSIS — R928 Other abnormal and inconclusive findings on diagnostic imaging of breast: Secondary | ICD-10-CM | POA: Diagnosis not present

## 2015-08-25 DIAGNOSIS — Z1231 Encounter for screening mammogram for malignant neoplasm of breast: Secondary | ICD-10-CM | POA: Diagnosis not present

## 2015-10-27 DIAGNOSIS — R05 Cough: Secondary | ICD-10-CM | POA: Diagnosis not present

## 2015-12-03 DIAGNOSIS — H04123 Dry eye syndrome of bilateral lacrimal glands: Secondary | ICD-10-CM | POA: Diagnosis not present

## 2015-12-03 DIAGNOSIS — H40013 Open angle with borderline findings, low risk, bilateral: Secondary | ICD-10-CM | POA: Diagnosis not present

## 2015-12-03 DIAGNOSIS — H1013 Acute atopic conjunctivitis, bilateral: Secondary | ICD-10-CM | POA: Diagnosis not present

## 2016-04-01 DIAGNOSIS — L298 Other pruritus: Secondary | ICD-10-CM | POA: Diagnosis not present

## 2016-04-01 DIAGNOSIS — Z85828 Personal history of other malignant neoplasm of skin: Secondary | ICD-10-CM | POA: Diagnosis not present

## 2016-05-11 DIAGNOSIS — I10 Essential (primary) hypertension: Secondary | ICD-10-CM | POA: Diagnosis not present

## 2016-05-11 DIAGNOSIS — T148 Other injury of unspecified body region: Secondary | ICD-10-CM | POA: Diagnosis not present

## 2016-06-16 ENCOUNTER — Encounter: Payer: Self-pay | Admitting: Internal Medicine

## 2016-06-19 DIAGNOSIS — Z23 Encounter for immunization: Secondary | ICD-10-CM | POA: Diagnosis not present

## 2016-06-23 ENCOUNTER — Encounter: Payer: Self-pay | Admitting: Internal Medicine

## 2016-07-01 DIAGNOSIS — H43393 Other vitreous opacities, bilateral: Secondary | ICD-10-CM | POA: Diagnosis not present

## 2016-07-01 DIAGNOSIS — H25013 Cortical age-related cataract, bilateral: Secondary | ICD-10-CM | POA: Diagnosis not present

## 2016-07-01 DIAGNOSIS — H40013 Open angle with borderline findings, low risk, bilateral: Secondary | ICD-10-CM | POA: Diagnosis not present

## 2016-07-01 DIAGNOSIS — H2513 Age-related nuclear cataract, bilateral: Secondary | ICD-10-CM | POA: Diagnosis not present

## 2016-07-15 ENCOUNTER — Ambulatory Visit: Payer: Medicare Other | Admitting: *Deleted

## 2016-07-15 VITALS — Ht 66.0 in | Wt 224.8 lb

## 2016-07-15 DIAGNOSIS — Z86010 Personal history of colonic polyps: Secondary | ICD-10-CM

## 2016-07-15 MED ORDER — NA SULFATE-K SULFATE-MG SULF 17.5-3.13-1.6 GM/177ML PO SOLN: 1.0000 | Freq: Once | ORAL | 0 refills | Status: AC

## 2016-07-15 NOTE — Progress Notes (Signed)
Denies allergies to eggs or soy products. Denies complications with sedation or anesthesia. Denies O2 use. Denies use of diet or weight loss medications.  Emmi instructions not given for colonoscopy, pt does not access email when not at work.

## 2016-07-19 DIAGNOSIS — L821 Other seborrheic keratosis: Secondary | ICD-10-CM | POA: Diagnosis not present

## 2016-07-19 DIAGNOSIS — D224 Melanocytic nevi of scalp and neck: Secondary | ICD-10-CM | POA: Diagnosis not present

## 2016-07-19 DIAGNOSIS — L814 Other melanin hyperpigmentation: Secondary | ICD-10-CM | POA: Diagnosis not present

## 2016-07-19 DIAGNOSIS — D2272 Melanocytic nevi of left lower limb, including hip: Secondary | ICD-10-CM | POA: Diagnosis not present

## 2016-07-19 DIAGNOSIS — D2271 Melanocytic nevi of right lower limb, including hip: Secondary | ICD-10-CM | POA: Diagnosis not present

## 2016-07-19 DIAGNOSIS — Z85828 Personal history of other malignant neoplasm of skin: Secondary | ICD-10-CM | POA: Diagnosis not present

## 2016-07-19 DIAGNOSIS — L918 Other hypertrophic disorders of the skin: Secondary | ICD-10-CM | POA: Diagnosis not present

## 2016-07-19 DIAGNOSIS — D2261 Melanocytic nevi of right upper limb, including shoulder: Secondary | ICD-10-CM | POA: Diagnosis not present

## 2016-07-19 DIAGNOSIS — D2262 Melanocytic nevi of left upper limb, including shoulder: Secondary | ICD-10-CM | POA: Diagnosis not present

## 2016-07-19 DIAGNOSIS — D225 Melanocytic nevi of trunk: Secondary | ICD-10-CM | POA: Diagnosis not present

## 2016-07-19 DIAGNOSIS — D1801 Hemangioma of skin and subcutaneous tissue: Secondary | ICD-10-CM | POA: Diagnosis not present

## 2016-07-27 ENCOUNTER — Ambulatory Visit (AMBULATORY_SURGERY_CENTER): Payer: Medicare Other | Admitting: Internal Medicine

## 2016-07-27 ENCOUNTER — Encounter: Payer: Self-pay | Admitting: Internal Medicine

## 2016-07-27 VITALS — BP 127/61 | HR 71 | Temp 98.0°F | Resp 12 | Ht 66.0 in | Wt 224.0 lb

## 2016-07-27 DIAGNOSIS — Z1211 Encounter for screening for malignant neoplasm of colon: Secondary | ICD-10-CM | POA: Diagnosis not present

## 2016-07-27 DIAGNOSIS — Z86010 Personal history of colonic polyps: Secondary | ICD-10-CM | POA: Diagnosis not present

## 2016-07-27 MED ORDER — SODIUM CHLORIDE 0.9 % IV SOLN: 500.0000 mL | INTRAVENOUS | Status: AC

## 2016-07-27 NOTE — Progress Notes (Signed)
No problems noted in the recovery room. maw 

## 2016-07-27 NOTE — Patient Instructions (Signed)
YOU HAD AN ENDOSCOPIC PROCEDURE TODAY AT [facility]:   Refer to the procedure report that was given to you for any specific questions about what was found during the examination.  If the procedure report does not answer your questions, please call your gastroenterologist to clarify.  If you requested that your care partner not be given the details of your procedure findings, then the procedure report has been included in a sealed envelope for you to review at your convenience later.  YOU SHOULD EXPECT: Some feelings of bloating in the abdomen. Passage of more gas than usual.  Walking can help get rid of the air that was put into your GI tract during the procedure and reduce the bloating. If you had a lower endoscopy (such as a colonoscopy or flexible sigmoidoscopy) you may notice spotting of blood in your stool or on the toilet paper. If you underwent a bowel prep for your procedure, you may not have a normal bowel movement for a few days.  Please Note:  You might notice some irritation and congestion in your nose or some drainage.  This is from the oxygen used during your procedure.  There is no need for concern and it should clear up in a day or so.  SYMPTOMS TO REPORT IMMEDIATELY:   Following lower endoscopy (colonoscopy or flexible sigmoidoscopy):  Excessive amounts of blood in the stool  Significant tenderness or worsening of abdominal pains  Swelling of the abdomen that is new, acute  Fever of 100F or higher   Following upper endoscopy (EGD)  Vomiting of blood or coffee ground material  New chest pain or pain under the shoulder blades  Painful or persistently difficult swallowing  New shortness of breath  Fever of 100F or higher  Black, tarry-looking stools  For urgent or emergent issues, a gastroenterologist can be reached at any hour by calling [phone].   DIET:  We do recommend a small meal at first, but then you may proceed to your regular diet.  Drink  plenty of fluids but you should avoid alcoholic beverages for 24 hours.  ACTIVITY:  You should plan to take it easy for the rest of today and you should NOT DRIVE or use heavy machinery until tomorrow (because of the sedation medicines used during the test).    FOLLOW UP: Our staff will call the number listed on your records the next business day following your procedure to check on you and address any questions or concerns that you may have regarding the information given to you following your procedure. If we do not reach you, we will leave a message.  However, if you are feeling well and you are not experiencing any problems, there is no need to return our call.  We will assume that you have returned to your regular daily activities without incident.  If any biopsies were taken you will be contacted by phone or by letter within the next 1-3 weeks.  Please call us at [phone] if you have not heard about the biopsies in 3 weeks.    SIGNATURES/CONFIDENTIALITY: You and/or your care partner have signed paperwork which will be entered into your electronic medical record.  These signatures attest to the fact that that the information above on your After Visit Summary has been reviewed and is understood.  Full responsibility of the confidentiality of this discharge information lies with you and/or your care-partner.    Handout was given to your care partner on hemorrhoids.  You may resume your current medications today. Repeat colonoscopy is not recommended for surveillance, due to age and favorable findings today. Please call if any questions or concerns.

## 2016-07-27 NOTE — Op Note (Signed)
[facility] Patient Name: Tracy Hawkins Procedure Date: 07/27/2016 1:17 PM MRN: [id number] Endoscopist: Docia Chuck. Henrene Pastor , MD Age: 70 Referring MD:  Date of Birth: [birth year] Gender: Female Account #: [id number] Procedure:                Colonoscopy Indications:              High risk colon cancer surveillance: Personal                            history of non-advanced adenoma. Previous                            examinations 2003 (right sided hyperplastic polyp);                            2012 (small tubular adenoma and SSP) Medicines:                Monitored Anesthesia Care Procedure:                Pre-Anesthesia Assessment:                           - Prior to the procedure, a History and Physical                            was performed, and patient medications and                            allergies were reviewed. The patient's tolerance of                            previous anesthesia was also reviewed. The risks                            and benefits of the procedure and the sedation                            options and risks were discussed with the patient.                            All questions were answered, and informed consent                            was obtained. Prior Anticoagulants: The patient has                            taken no previous anticoagulant or antiplatelet                            agents. ASA Grade Assessment: II - A patient with                            mild systemic disease. After reviewing the risks  and benefits, the patient was deemed in                            satisfactory condition to undergo the procedure.                           After obtaining informed consent, the colonoscope                            was passed under direct vision. Throughout the                            procedure, the patient's blood pressure, pulse, and                            oxygen saturations were monitored  continuously. The                            Model CF-HQ190L [id number]) scope was introduced                            through the anus and advanced to the the cecum,                            identified by appendiceal orifice and ileocecal                            valve. The ileocecal valve, appendiceal orifice,                            and rectum were photographed. The quality of the                            bowel preparation was excellent. The colonoscopy                            was performed without difficulty. The patient                            tolerated the procedure well. The bowel preparation                            used was SUPREP. Scope In: 1:25:46 PM Scope Out: 1:38:30 PM Scope Withdrawal Time: 0 hours 9 minutes 21 seconds  Total Procedure Duration: 0 hours 12 minutes 44 seconds  Findings:                 Internal hemorrhoids were found during retroflexion.                           The exam was otherwise without abnormality on                            direct and retroflexion views. Complications:  No immediate complications. Estimated blood loss:                            None. Estimated Blood Loss:     Estimated blood loss: none. Impression:               - Internal hemorrhoids.                           - The examination was otherwise normal on direct                            and retroflexion views.                           - No specimens collected. Recommendation:           - Repeat colonoscopy is not recommended for                            surveillance, given your current age and favorable                            findings today.                           - Patient has a contact number available for                            emergencies. The signs and symptoms of potential                            delayed complications were discussed with the                            patient. Return to normal activities tomorrow.                             Written discharge instructions were provided to the                            patient.                           - Resume previous diet.                           - Continue present medications. Docia Chuck. Henrene Pastor, MD 07/27/2016 1:45:11 PM This report has been signed electronically.

## 2016-07-28 ENCOUNTER — Telehealth: Payer: Self-pay | Admitting: *Deleted

## 2016-07-28 NOTE — Telephone Encounter (Signed)
  Follow up Call-  Call back number 07/27/2016  Post procedure Call Back phone  # [phone]  Permission to leave phone message Yes  Some recent data might be hidden     Patient questions:  Do you have a fever, pain , or abdominal swelling? No. Pain Score  0 *  Have you tolerated food without any problems? Yes.    Have you been able to return to your normal activities? Yes.    Do you have any questions about your discharge instructions: Diet   No. Medications  No. Follow up visit  No.  Do you have questions or concerns about your Care? No.  Actions: * If pain score is 4 or above: No action needed, pain <4.

## 2016-08-12 DIAGNOSIS — Z1231 Encounter for screening mammogram for malignant neoplasm of breast: Secondary | ICD-10-CM | POA: Diagnosis not present

## 2016-08-13 DIAGNOSIS — R399 Unspecified symptoms and signs involving the genitourinary system: Secondary | ICD-10-CM | POA: Diagnosis not present

## 2016-08-13 DIAGNOSIS — R140 Abdominal distension (gaseous): Secondary | ICD-10-CM | POA: Diagnosis not present

## 2016-08-13 DIAGNOSIS — R102 Pelvic and perineal pain: Secondary | ICD-10-CM | POA: Diagnosis not present

## 2016-08-13 DIAGNOSIS — N3000 Acute cystitis without hematuria: Secondary | ICD-10-CM | POA: Diagnosis not present

## 2016-08-13 DIAGNOSIS — R938 Abnormal findings on diagnostic imaging of other specified body structures: Secondary | ICD-10-CM | POA: Diagnosis not present

## 2016-08-23 DIAGNOSIS — N840 Polyp of corpus uteri: Secondary | ICD-10-CM | POA: Diagnosis not present

## 2016-08-23 DIAGNOSIS — R938 Abnormal findings on diagnostic imaging of other specified body structures: Secondary | ICD-10-CM | POA: Diagnosis not present

## 2016-11-02 DIAGNOSIS — R609 Edema, unspecified: Secondary | ICD-10-CM | POA: Diagnosis not present

## 2016-11-02 DIAGNOSIS — M79609 Pain in unspecified limb: Secondary | ICD-10-CM | POA: Diagnosis not present

## 2016-11-02 DIAGNOSIS — I8310 Varicose veins of unspecified lower extremity with inflammation: Secondary | ICD-10-CM | POA: Diagnosis not present

## 2016-11-02 DIAGNOSIS — R600 Localized edema: Secondary | ICD-10-CM | POA: Diagnosis not present

## 2016-11-02 DIAGNOSIS — I872 Venous insufficiency (chronic) (peripheral): Secondary | ICD-10-CM | POA: Diagnosis not present

## 2016-11-02 DIAGNOSIS — M79605 Pain in left leg: Secondary | ICD-10-CM | POA: Diagnosis not present

## 2016-11-03 ENCOUNTER — Encounter: Payer: Self-pay | Admitting: Vascular Surgery

## 2016-11-04 ENCOUNTER — Ambulatory Visit (INDEPENDENT_AMBULATORY_CARE_PROVIDER_SITE_OTHER): Payer: Medicare Other | Admitting: Vascular Surgery

## 2016-11-04 ENCOUNTER — Encounter: Payer: Self-pay | Admitting: Vascular Surgery

## 2016-11-04 VITALS — BP 135/86 | HR 86 | Temp 97.4°F | Resp 16 | Ht 66.0 in | Wt 217.0 lb

## 2016-11-04 DIAGNOSIS — I83892 Varicose veins of left lower extremities with other complications: Secondary | ICD-10-CM

## 2016-11-04 NOTE — Progress Notes (Signed)
[facility] [place]  Patient name: Tracy Hawkins MRN: [id number] DOB: 11-Aug-1946 Sex: female  REASON FOR CONSULT: Evaluation of left leg pain  HPI: Tracy Hawkins is a 71 y.o. female, who is known to me from prior laser ablation of her right great saphenous vein and stab phlebectomy of multiple tributary varicosities in 2013. 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 She noticed 3 days ago that she had episode of pain and sensation of swelling and burning on the posterior calf and lateral left thigh. She does have extensive varicosities on the medial left thigh. She underwent a rule out DVT study at an [facility] and was told that she did not have any evidence of DVT. She is seen today for further discussion. She reports that she has had some resolution but does continue to have this difficulty with discomfort in her posterior calf and lateral thigh on the left. She does not have any other history of superficial thrombophlebitis or recurrent bleeding since her treatment on the right. She is very pleased with her outcome on the right reports no difficulty with her right leg. She has not been wearing compression garments.  Past Medical History:  Diagnosis Date  . Benign breast cyst in female   . Colon polyps   . Fracture, shoulder   . Hyperlipidemia   . Hypertension   . Osteopenia   . Varicose veins     Family History  Problem Relation Age of Onset  . COPD Father   . Colon cancer Neg Hx   . Colon polyps Neg Hx   . Stomach cancer Neg Hx   . Rectal cancer Neg Hx     SOCIAL HISTORY: Social History   Social History  . Marital status: Married    Spouse name: N/A  . Number of children: N/A  . Years of education: N/A   Occupational History  . Not on file.   Social History Main Topics  . Smoking status: Never Smoker  . Smokeless tobacco: Never Used  . Alcohol  use Yes     Comment: socially  . Drug use: No  . Sexual activity: Not on file   Other Topics Concern  . Not on file   Social History Narrative  . No narrative on file    Allergies  Allergen Reactions  . Codeine Nausea Only    Current Outpatient Prescriptions  Medication Sig Dispense Refill  . Calcium Carbonate-Vitamin D (CALTRATE 600+D PO) Take by mouth 2 (two) times daily.      . Multiple Vitamin (MULTIVITAMIN) tablet Take 1 tablet by mouth daily.      Marland Kitchen BIOTIN FORTE PO Take 600 mg by mouth daily.    . naproxen sodium (ANAPROX) 220 MG tablet Take 220 mg by mouth daily.     Current Facility-Administered Medications  Medication Dose Route Frequency Provider Last Rate Last Dose  . 0.9 %  sodium chloride infusion  500 mL Intravenous Continuous Irene Shipper, MD        REVIEW OF SYSTEMS:  [X]  denotes positive finding, [ ]  denotes negative finding Cardiac  Comments:  Chest pain or chest pressure:    Shortness of breath upon exertion:    Short of breath when lying flat:    Irregular heart rhythm:        Vascular    Pain in calf, thigh, or hip brought on by ambulation:  Pain in feet at night that wakes you up from your sleep:  x With recent event   Blood clot in your veins:    Leg swelling:  x       Pulmonary    Oxygen at home:    Productive cough:     Wheezing:         Neurologic    Sudden weakness in arms or legs:   With recent event   Sudden numbness in arms or legs:     Sudden onset of difficulty speaking or slurred speech:    Temporary loss of vision in one eye:     Problems with dizziness:         Gastrointestinal    Blood in stool:     Vomited blood:         Genitourinary    Burning when urinating:     Blood in urine:        Psychiatric    Major depression:         Hematologic    Bleeding problems:    Problems with blood clotting too easily:        Skin    Rashes or ulcers:        Constitutional    Fever or chills:      PHYSICAL  EXAM: Vitals:   11/04/16 0930  BP: 135/86  Pulse: 86  Resp: 16  Temp: 97.4 F (36.3 C)  SpO2: 97%  Weight: 217 lb (98.4 kg)  Height: 5\' 6"  (1.676 m)    GENERAL: The patient is a well-nourished female, in no acute distress. The vital signs are documented above. CARDIOVASCULAR: 2+ radial and 2+ dorsalis pedis pulses bilaterally PULMONARY: There is good air exchange  ABDOMEN: Soft and non-tender  MUSCULOSKELETAL: There are no major deformities or cyanosis. NEUROLOGIC: No focal weakness or paresthesias are detected. SKIN: There are no ulcers or rashes noted. PSYCHIATRIC: The patient has a normal affect. She does have large varicosities on the medial side of her left calf. There is mild tenderness in this. There does not appear to be thrombus in these varicosities by physical exam.  DATA:  I imaged her veins with SonoSite ultrasound. This does show a extremely enlarged refluxing left great saphenous vein extending into the large varicosities in her medial calf. It appears as though she does have acute thrombus in her small saphenous vein. Difficult to visualize her popliteal vein with SonoSite. She did have a formal venous duplex earlier this week showing no DVT.  MEDICAL ISSUES: I discussed the significance of this detail with patient. It appears that she has had a acute event of superficial thrombophlebitis involving her small saphenous vein. She had a negative DVT study at an outlying center. I have advised her on elevation and possible although she does not have to restrict her activities. I have also recommended ibuprofen for anti-inflammatory and pain relief. Also reported trying heat to determine if this is better potentially ice as well. We will see her again in 2-3 weeks with reflux duplex evaluation to determine if this is resolving and to rule out extension into her popliteal vein. Make further recommendations that time. She is having significant symptoms related to her saphenous  vein reflux into these large varicosities is and is interested in potential treatment for this. We have encouraged her to resume use of her thigh high compression which she will begin the to determine if this will give her symptomatic relief   Arvilla Meres.  Kaitlen Redford, MD FACS [facility] Tel [phone] Pager [phone]

## 2016-11-05 ENCOUNTER — Encounter: Payer: Self-pay | Admitting: Vascular Surgery

## 2016-11-08 ENCOUNTER — Ambulatory Visit (HOSPITAL_COMMUNITY)
Admission: RE | Admit: 2016-11-08 | Discharge: 2016-11-08 | Disposition: A | Discharge status: Home / Self Care | Payer: Medicare Other | Source: Ambulatory Visit | Attending: Family | Admitting: Family

## 2016-11-08 DIAGNOSIS — I83892 Varicose veins of left lower extremities with other complications: Secondary | ICD-10-CM | POA: Diagnosis not present

## 2016-11-08 NOTE — Addendum Note (Signed)
Addended by: Lianne Cure A on: 11/08/2016 09:06 AM   Modules accepted: Orders

## 2016-11-09 ENCOUNTER — Ambulatory Visit: Payer: Medicare Other | Admitting: Vascular Surgery

## 2016-11-16 ENCOUNTER — Ambulatory Visit: Payer: Medicare Other | Admitting: Vascular Surgery

## 2016-11-23 ENCOUNTER — Encounter: Payer: Self-pay | Admitting: Vascular Surgery

## 2016-11-23 ENCOUNTER — Other Ambulatory Visit: Payer: Self-pay | Admitting: *Deleted

## 2016-11-23 ENCOUNTER — Ambulatory Visit (INDEPENDENT_AMBULATORY_CARE_PROVIDER_SITE_OTHER): Payer: Medicare Other | Admitting: Vascular Surgery

## 2016-11-23 VITALS — BP 148/81 | HR 85 | Temp 97.6°F | Resp 16 | Ht 66.0 in | Wt 217.0 lb

## 2016-11-23 DIAGNOSIS — I83892 Varicose veins of left lower extremities with other complications: Secondary | ICD-10-CM | POA: Diagnosis not present

## 2016-11-23 DIAGNOSIS — I83812 Varicose veins of left lower extremities with pain: Secondary | ICD-10-CM

## 2016-11-23 NOTE — Progress Notes (Signed)
[facility] [place]  Patient name: Tracy Hawkins MRN: [id number] DOB: [birth year] Sex: female  REASON FOR VISIT: Follow-up of venous pathology left leg  HPI: Tracy Hawkins is a 71 y.o. female here today for continued follow-up. I'd seen her approximately 3 weeks ago. She had developed difficulty in her left leg with pain. She had a duplex in an [facility] facility showing no evidence of DVT. I imaged her superficial veins with SonoSite at that time showing gross reflux throughout her very enlarged great saphenous vein on the left. Did appear that she had to evidence of superficial thrombophlebitis as well. She underwent a formal venous duplex several days later and this did confirm what I had seen with SonoSite and that she does have reflux throughout her left great saphenous vein which is quite enlarged throughout its course. This does extend into the painful varicosities. She has had some symptomatic relief with ibuprofen although she still has a great deal of pain. 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 She has been compliant with her thigh high compression as well.  Past Medical History:  Diagnosis Date  . Benign breast cyst in female   . Colon polyps   . Fracture, shoulder   . Hyperlipidemia   . Hypertension   . Osteopenia   . Varicose veins     Family History  Problem Relation Age of Onset  . COPD Father   . Colon cancer Neg Hx   . Colon polyps Neg Hx   . Stomach cancer Neg Hx   . Rectal cancer Neg Hx     SOCIAL HISTORY: Social History  Substance Use Topics  . Smoking status: Never Smoker  . Smokeless tobacco: Never Used  . Alcohol use Yes     Comment: socially    Allergies  Allergen Reactions  . Codeine Nausea Only    Current Outpatient Prescriptions  Medication Sig Dispense Refill  . BIOTIN FORTE PO Take 600 mg by mouth daily.    .  Calcium Carbonate-Vitamin D (CALTRATE 600+D PO) Take by mouth 2 (two) times daily.      . Multiple Vitamin (MULTIVITAMIN) tablet Take 1 tablet by mouth daily.      . naproxen sodium (ANAPROX) 220 MG tablet Take 220 mg by mouth daily.     Current Facility-Administered Medications  Medication Dose Route Frequency Provider Last Rate Last Dose  . 0.9 %  sodium chloride infusion  500 mL Intravenous Continuous Irene Shipper, MD        REVIEW OF SYSTEMS:  [X]  denotes positive finding, [ ]  denotes negative finding Cardiac  Comments:  Chest pain or chest pressure:    Shortness of breath upon exertion:    Short of breath when lying flat:    Irregular heart rhythm:        Vascular    Pain in calf, thigh, or hip brought on by ambulation:    Pain in feet at night that wakes you up from your sleep:     Blood clot in your veins:    Leg swelling:  c         PHYSICAL EXAM: Vitals:   11/23/16 1113  BP: (!) 148/81  Pulse: 85  Resp: 16  Temp: 97.6 F (36.4 C)  SpO2: 98%  Weight: 217 lb (98.4 kg)  Height: 5\' 6"  (1.676 m)    GENERAL: The patient is a well-nourished  female, in no acute distress. The vital signs are documented above. CARDIOVASCULAR: 2+ dorsalis pedis pulses bilaterally. Large varicosities on the medial aspect of her left proximal calf PULMONARY: There is good air exchange  MUSCULOSKELETAL: There are no major deformities or cyanosis. NEUROLOGIC: No focal weakness or paresthesias are detected. SKIN: There are no ulcers or rashes noted. PSYCHIATRIC: The patient has a normal affect.  DATA:  Formal duplex from 11/08/2016 was reviewed showing enlargement and reflux in her great saphenous vein  MEDICAL ISSUES: No evidence of DVT. The patient was reassured regarding this. She will continue her use of ibuprofen elevation compression. We will see her at 3 months to determine if this is being adequate in controlling her symptoms. She would be an excellent candidate for ablation of her  refluxing great saphenous vein if she fails conservative treatment on the left    Rosetta Posner, MD [facility] Tel [phone] Pager [phone]

## 2017-01-10 DIAGNOSIS — H16223 Keratoconjunctivitis sicca, not specified as Sjogren's, bilateral: Secondary | ICD-10-CM | POA: Diagnosis not present

## 2017-01-10 DIAGNOSIS — H01003 Unspecified blepharitis right eye, unspecified eyelid: Secondary | ICD-10-CM | POA: Diagnosis not present

## 2017-01-10 DIAGNOSIS — H40013 Open angle with borderline findings, low risk, bilateral: Secondary | ICD-10-CM | POA: Diagnosis not present

## 2017-01-10 DIAGNOSIS — H04123 Dry eye syndrome of bilateral lacrimal glands: Secondary | ICD-10-CM | POA: Diagnosis not present

## 2017-01-26 ENCOUNTER — Encounter: Payer: Self-pay | Admitting: Vascular Surgery

## 2017-02-01 ENCOUNTER — Ambulatory Visit: Payer: Medicare Other | Admitting: Vascular Surgery

## 2017-02-08 ENCOUNTER — Other Ambulatory Visit: Payer: Self-pay | Admitting: *Deleted

## 2017-02-08 ENCOUNTER — Encounter: Payer: Self-pay | Admitting: Vascular Surgery

## 2017-02-08 ENCOUNTER — Ambulatory Visit: Payer: Medicare Other | Admitting: Vascular Surgery

## 2017-02-08 ENCOUNTER — Ambulatory Visit (INDEPENDENT_AMBULATORY_CARE_PROVIDER_SITE_OTHER): Payer: Medicare Other | Admitting: Vascular Surgery

## 2017-02-08 VITALS — BP 128/74 | HR 76 | Resp 18 | Ht 66.0 in | Wt 224.0 lb

## 2017-02-08 DIAGNOSIS — I83893 Varicose veins of bilateral lower extremities with other complications: Secondary | ICD-10-CM

## 2017-02-08 DIAGNOSIS — I83899 Varicose veins of unspecified lower extremities with other complications: Secondary | ICD-10-CM | POA: Diagnosis not present

## 2017-02-08 NOTE — Progress Notes (Signed)
[facility] [place]  Patient name: Tracy Hawkins MRN: [id number] DOB: [birth year] Sex: female  REASON FOR VISIT: follow-up varicose veins  HPI: Tracy Hawkins is a 71 y.o. female who presents for 3 month follow-up. She was last seen on 11/23/16 for left leg pain. Her reflux exam at that time showed a markedly enlarged left great saphenous vein with reflux. She has varicose veins associated with this that cause a great deal of pain. She also had superficial thrombophlebitis as well. She was recommended conservative management with ibuprofen, thigh high compression stockings and elevation. She has been compliant with her compression stockings and using ibuprofen as needed.   Today, she reports minimal relief of her symptoms with conservative therapies. She also reports some pain along the inner thighs bilaterally that occur at night. This started a few weeks ago.   Past Medical History:  Diagnosis Date  . Benign breast cyst in female   . Colon polyps   . Fracture, shoulder   . Hyperlipidemia   . Hypertension   . Osteopenia   . Varicose veins     Family History  Problem Relation Age of Onset  . COPD Father   . Colon cancer Neg Hx   . Colon polyps Neg Hx   . Stomach cancer Neg Hx   . Rectal cancer Neg Hx     SOCIAL HISTORY: Social History  Substance Use Topics  . Smoking status: Never Smoker  . Smokeless tobacco: Never Used  . Alcohol use Yes     Comment: socially    Allergies  Allergen Reactions  . Codeine Nausea Only    Current Outpatient Prescriptions  Medication Sig Dispense Refill  . BIOTIN FORTE PO Take 600 mg by mouth daily.    . Multiple Vitamin (MULTIVITAMIN) tablet Take 1 tablet by mouth daily.      . naproxen sodium (ANAPROX) 220 MG tablet Take 220 mg by mouth daily.    . Calcium Carbonate-Vitamin D (CALTRATE 600+D PO) Take by mouth 2 (two) times daily.       Current Facility-Administered Medications  Medication Dose Route  Frequency Provider Last Rate Last Dose  . 0.9 %  sodium chloride infusion  500 mL Intravenous Continuous Irene Shipper, MD        REVIEW OF SYSTEMS:  [X]  denotes positive finding, [ ]  denotes negative finding Cardiac  Comments:  Chest pain or chest pressure:    Shortness of breath upon exertion:    Short of breath when lying flat:    Irregular heart rhythm:        Vascular    Pain in calf, thigh, or hip brought on by ambulation:    Pain in feet at night that wakes you up from your sleep:     Blood clot in your veins:    Leg swelling:  x       Pulmonary    Oxygen at home:    Productive cough:     Wheezing:         Neurologic    Sudden weakness in arms or legs:     Sudden numbness in arms or legs:     Sudden onset of difficulty speaking or slurred speech:    Temporary loss of vision in one eye:     Problems with dizziness:         Gastrointestinal    Blood in stool:     Vomited blood:  Genitourinary    Burning when urinating:     Blood in urine:        Psychiatric    Major depression:         Hematologic    Bleeding problems:    Problems with blood clotting too easily:        Skin    Rashes or ulcers:        Constitutional    Fever or chills:      PHYSICAL EXAM: Vitals:   02/08/17 1552  BP: 128/74  Pulse: 76  Resp: 18  SpO2: 96%  Weight: 224 lb (101.6 kg)  Height: 5\' 6"  (1.676 m)    GENERAL: The patient is a well-nourished female, in no acute distress. The vital signs are documented above. HEENT: normocephalic, atraumatic. No abnormalities noted.  CARDIAC: There is a regular rate and rhythm.  VASCULAR: 2+ left DP pulse. Left leg swelling with large varicose vein at medial aspect left proximal calf.  PULMONARY: There is good air exchange bilaterally without wheezing or rales. ABDOMEN: Soft and non-tender with normal pitched bowel sounds.  MUSCULOSKELETAL: There are no major deformities or cyanosis. NEUROLOGIC: No focal weakness or paresthesias  are detected. SKIN: There are no ulcers or rashes noted. PSYCHIATRIC: The patient has a normal affect.  DATA:  Left leg venous reflux exam from 11/08/16 shows a diffusely dilated left great saphenous vein with reflux  MEDICAL ISSUES: Varicose veins with complications, left  The patient has failed conservative management of her left leg pain and swelling. Plan for laser ablation and stab phlebectomy left leg on 02/24/17.   Virgina Jock, PA-C [facility] [place] [facility] MD: Early  I have examined the patient, reviewed and agree with above. Continues to have discomfort despite elevation and compression. Would be an excellent candidate for laser ablation of her great saphenous vein. Also has a few large varices just below the knee and remove these with stab phlebectomy technique as well. Curt Jews, MD 02/08/2017 4:40 PM

## 2017-02-10 ENCOUNTER — Other Ambulatory Visit: Payer: Medicare Other | Admitting: Vascular Surgery

## 2017-02-14 ENCOUNTER — Encounter: Payer: Self-pay | Admitting: Vascular Surgery

## 2017-02-17 ENCOUNTER — Ambulatory Visit: Payer: Medicare Other | Admitting: Vascular Surgery

## 2017-02-17 ENCOUNTER — Encounter (HOSPITAL_COMMUNITY): Payer: Medicare Other

## 2017-02-24 ENCOUNTER — Other Ambulatory Visit: Payer: Medicare Other | Admitting: Vascular Surgery

## 2017-03-03 ENCOUNTER — Ambulatory Visit: Payer: Medicare Other | Admitting: Vascular Surgery

## 2017-03-03 ENCOUNTER — Encounter (HOSPITAL_COMMUNITY): Payer: Medicare Other

## 2017-03-08 ENCOUNTER — Encounter (HOSPITAL_COMMUNITY): Payer: Medicare Other

## 2017-03-08 ENCOUNTER — Ambulatory Visit: Payer: Medicare Other | Admitting: Vascular Surgery

## 2017-03-18 DIAGNOSIS — K0889 Other specified disorders of teeth and supporting structures: Secondary | ICD-10-CM | POA: Diagnosis not present

## 2017-03-18 DIAGNOSIS — H9201 Otalgia, right ear: Secondary | ICD-10-CM | POA: Diagnosis not present

## 2017-03-25 DIAGNOSIS — H9201 Otalgia, right ear: Secondary | ICD-10-CM | POA: Diagnosis not present

## 2017-03-25 DIAGNOSIS — K0889 Other specified disorders of teeth and supporting structures: Secondary | ICD-10-CM | POA: Diagnosis not present

## 2017-04-28 ENCOUNTER — Other Ambulatory Visit: Payer: Medicare Other | Admitting: Vascular Surgery

## 2017-05-06 ENCOUNTER — Ambulatory Visit: Payer: Medicare Other | Admitting: Vascular Surgery

## 2017-05-06 ENCOUNTER — Encounter (HOSPITAL_COMMUNITY): Payer: Medicare Other

## 2017-06-21 DIAGNOSIS — Z23 Encounter for immunization: Secondary | ICD-10-CM | POA: Diagnosis not present

## 2017-06-23 ENCOUNTER — Encounter: Payer: Self-pay | Admitting: Vascular Surgery

## 2017-06-23 ENCOUNTER — Ambulatory Visit (INDEPENDENT_AMBULATORY_CARE_PROVIDER_SITE_OTHER): Payer: Medicare Other | Admitting: Vascular Surgery

## 2017-06-23 VITALS — BP 143/82 | HR 85 | Temp 97.4°F | Resp 16 | Ht 66.0 in | Wt 215.0 lb

## 2017-06-23 DIAGNOSIS — I83899 Varicose veins of unspecified lower extremities with other complications: Secondary | ICD-10-CM | POA: Diagnosis not present

## 2017-06-23 NOTE — Progress Notes (Signed)
     Laser Ablation Procedure    Date: 06/23/2017   Tracy Hawkins DOB:1946-07-29  Consent signed: Yes    Surgeon:  Dr. Sherren Mocha Early  Procedure: Laser Ablation: left Greater Saphenous Vein  BP (!) 143/82   Pulse 85   Temp (!) 97.4 F (36.3 C)   Resp 16   Ht 5\' 6"  (1.676 m)   Wt 215 lb (97.5 kg)   SpO2 100%   BMI 34.70 kg/m   Tumescent Anesthesia: 450 cc 0.9% NaCl with 50 cc Lidocaine HCL with 1% Epi and 15 cc 8.4% NaHCO3  Local Anesthesia: 4 cc Lidocaine HCL and NaHCO3 (ratio 2:1)  15 watts continuous mode        Total energy: 2314   Total time: 2:34    Stab Phlebectomy: 5 Sites: Calf  Patient tolerated procedure well  Notes:   Description of Procedure:  After marking the course of the secondary varicosities, the patient was placed on the operating table in the supine position, and the left leg was prepped and draped in sterile fashion.   Local anesthetic was administered and under ultrasound guidance the saphenous vein was accessed with a micro needle and guide wire; then the mirco puncture sheath was placed.  A guide wire was inserted saphenofemoral junction , followed by a 5 french sheath.  The position of the sheath and then the laser fiber below the junction was confirmed using the ultrasound.  Tumescent anesthesia was administered along the course of the saphenous vein using ultrasound guidance. The patient was placed in Trendelenburg position and protective laser glasses were placed on patient and staff, and the laser was fired at 15 watts continuous mode advancing 1-79mm/second for a total of 2314 joules.   For stab phlebectomies, local anesthetic was administered at the previously marked varicosities, and tumescent anesthesia was administered around the vessels. Five stab wounds were made using the tip of an 11 blade. And using the vein hook, the phlebectomies were performed using a hemostat to avulse the varicosities.  Adequate hemostasis was achieved.     Steri  strips were applied to the stab wounds and ABD pads and thigh high compression stockings were applied.  Ace wrap bandages were applied over the phlebectomy sites and at the top of the saphenofemoral junction. Blood loss was less than 15 cc.  The patient ambulated out of the operating room having tolerated the procedure well.  Uneventful ablation from just below her knee to just below the saphenofemoral junction.  Also had stab phlebectomy of several large varices at the lower end of her saphenous vein ablation site on her medial calf.  Will be seen again in 1 week for follow-up

## 2017-06-28 ENCOUNTER — Ambulatory Visit (INDEPENDENT_AMBULATORY_CARE_PROVIDER_SITE_OTHER): Payer: Medicare Other | Admitting: Vascular Surgery

## 2017-06-28 ENCOUNTER — Ambulatory Visit (HOSPITAL_COMMUNITY)
Admission: RE | Admit: 2017-06-28 | Discharge: 2017-06-28 | Disposition: A | Discharge status: Home / Self Care | Payer: Medicare Other | Source: Ambulatory Visit | Attending: Vascular Surgery | Admitting: Vascular Surgery

## 2017-06-28 ENCOUNTER — Encounter (HOSPITAL_COMMUNITY): Payer: Self-pay

## 2017-06-28 ENCOUNTER — Encounter: Payer: Self-pay | Admitting: Vascular Surgery

## 2017-06-28 VITALS — BP 158/88 | HR 73 | Temp 97.4°F | Resp 16 | Ht 66.0 in | Wt 220.0 lb

## 2017-06-28 DIAGNOSIS — I824Z2 Acute embolism and thrombosis of unspecified deep veins of left distal lower extremity: Secondary | ICD-10-CM | POA: Diagnosis not present

## 2017-06-28 DIAGNOSIS — I83899 Varicose veins of unspecified lower extremities with other complications: Secondary | ICD-10-CM

## 2017-06-28 DIAGNOSIS — Z9889 Other specified postprocedural states: Secondary | ICD-10-CM | POA: Insufficient documentation

## 2017-06-28 DIAGNOSIS — I83812 Varicose veins of left lower extremities with pain: Secondary | ICD-10-CM | POA: Diagnosis not present

## 2017-06-28 DIAGNOSIS — I83893 Varicose veins of bilateral lower extremities with other complications: Secondary | ICD-10-CM

## 2017-06-28 NOTE — Progress Notes (Signed)
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 female here today for follow-up.  She underwent laser ablation of her left great saphenous vein on 06/23/2017.  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  She has been compliant with her compression garments.  She does report moderate to severe soreness in her ablation area over her thigh and this is resolving.  Past Medical History:  Diagnosis Date  . Benign breast cyst in female   . Colon polyps   . Fracture, shoulder   . Hyperlipidemia   . Hypertension   . Osteopenia   . Varicose veins     Family History  Problem Relation Age of Onset  . COPD Father   . Colon cancer Neg Hx   . Colon polyps Neg Hx   . Stomach cancer Neg Hx   . Rectal cancer Neg Hx     SOCIAL HISTORY: Social History  Substance Use Topics  . Smoking status: Never Smoker  . Smokeless tobacco: Never Used  . Alcohol use Yes     Comment: socially    Allergies  Allergen Reactions  . Codeine Nausea Only    Current Outpatient Prescriptions  Medication Sig Dispense Refill  . BIOTIN FORTE PO Take 600 mg by mouth daily.    . Calcium Carbonate-Vitamin D (CALTRATE 600+D PO) Take by mouth 2 (two) times daily.      . Multiple Vitamin (MULTIVITAMIN) tablet Take 1 tablet by mouth daily.      . naproxen sodium (ANAPROX) 220 MG tablet Take 220 mg by mouth daily.     Current Facility-Administered Medications  Medication Dose Route Frequency Provider Last Rate Last Dose  . 0.9 %  sodium chloride infusion  500 mL Intravenous Continuous Irene Shipper, MD        REVIEW OF SYSTEMS:  [X]  denotes positive finding, [ ]  denotes  negative finding Cardiac  Comments:  Chest pain or chest pressure:    Shortness of breath upon exertion:    Short of breath when lying flat:    Irregular heart rhythm:        Vascular    Pain in calf, thigh, or hip brought on by ambulation:    Pain in feet at night that wakes you up from your sleep:     Blood clot in your veins:    Leg swelling:  x         PHYSICAL EXAM: Vitals:   06/28/17 1111  BP: (!) 158/88  Pulse: 73  Resp: 16  Temp: (!) 97.4 F (36.3 C)  SpO2: 98%  Weight: 220 lb (99.8 kg)  Height: 5\' 6"  (1.676 m)    GENERAL: The patient is a well-nourished female, in no acute distress. The vital signs are documented above. CARDIOVASCULAR: Palpable radial and dorsalis pedis pulse. PULMONARY: There is good air exchange  MUSCULOSKELETAL: There are no major deformities or cyanosis. NEUROLOGIC: No focal weakness or paresthesias are detected. SKIN: There are no ulcers or rashes noted.  Moderate bruising on her left medial thigh PSYCHIATRIC: The patient has a normal affect.  DATA:  Duplex reveals closure of her great saphenous vein from the proximal calf to the level of the  common femoral vein with no extension into the common femoral vein and no other DVT  MEDICAL ISSUES: Stable following ablation of her left great saphenous vein for symptomatic varicosities.  Will wear compression for an additional week and then on an as-needed basis.  She will see [place] again as needed.    Rosetta Posner, MD FACS [facility] Tel [phone] Pager [phone]

## 2017-07-04 DIAGNOSIS — H43393 Other vitreous opacities, bilateral: Secondary | ICD-10-CM | POA: Diagnosis not present

## 2017-07-04 DIAGNOSIS — H40013 Open angle with borderline findings, low risk, bilateral: Secondary | ICD-10-CM | POA: Diagnosis not present

## 2017-07-04 DIAGNOSIS — H25013 Cortical age-related cataract, bilateral: Secondary | ICD-10-CM | POA: Diagnosis not present

## 2017-07-04 DIAGNOSIS — H2513 Age-related nuclear cataract, bilateral: Secondary | ICD-10-CM | POA: Diagnosis not present

## 2017-08-08 ENCOUNTER — Telehealth: Payer: Self-pay | Admitting: *Deleted

## 2017-08-08 NOTE — Telephone Encounter (Signed)
Returning Mrs. Korpela's earlier telephone message.  Mrs. Blish is s/p endovenous laser ablation left greater saphenous vein and stab phlebectomy 5 incisions left leg on 06-28-2017 by Curt Jews MD.  Tracy Hawkins states she noticed last week "a small knot at the side of my left knee that is about the size of a dime that is sore to touch and a little red."   Ms. Faraci denies trauma or falls to left leg. Mrs. Willingham denies left ankle/foot swelling.  Mrs. Schwieger is wearing her compression hose and using heat to affected area.  Encouraged her to keep left leg elevated and use Ibuprofen for leg discomfort. Mrs. Nachtigal would like to have Dr. Donnetta Hutching evaluate tomorrow if possible. Will review with Dr. Donnetta Hutching and work her in for follow up visit tomorrow if needed.

## 2017-08-22 DIAGNOSIS — Z1231 Encounter for screening mammogram for malignant neoplasm of breast: Secondary | ICD-10-CM | POA: Diagnosis not present

## 2017-08-24 DIAGNOSIS — Z01419 Encounter for gynecological examination (general) (routine) without abnormal findings: Secondary | ICD-10-CM | POA: Diagnosis not present

## 2017-08-24 DIAGNOSIS — N840 Polyp of corpus uteri: Secondary | ICD-10-CM | POA: Diagnosis not present

## 2017-08-24 DIAGNOSIS — Z124 Encounter for screening for malignant neoplasm of cervix: Secondary | ICD-10-CM | POA: Diagnosis not present

## 2017-08-25 DIAGNOSIS — Z124 Encounter for screening for malignant neoplasm of cervix: Secondary | ICD-10-CM | POA: Diagnosis not present

## 2018-02-13 DIAGNOSIS — H04123 Dry eye syndrome of bilateral lacrimal glands: Secondary | ICD-10-CM | POA: Diagnosis not present

## 2018-02-13 DIAGNOSIS — H1013 Acute atopic conjunctivitis, bilateral: Secondary | ICD-10-CM | POA: Diagnosis not present

## 2018-02-13 DIAGNOSIS — H01003 Unspecified blepharitis right eye, unspecified eyelid: Secondary | ICD-10-CM | POA: Diagnosis not present

## 2018-02-13 DIAGNOSIS — H40013 Open angle with borderline findings, low risk, bilateral: Secondary | ICD-10-CM | POA: Diagnosis not present

## 2018-04-27 DIAGNOSIS — I10 Essential (primary) hypertension: Secondary | ICD-10-CM | POA: Diagnosis not present

## 2018-04-27 DIAGNOSIS — H9201 Otalgia, right ear: Secondary | ICD-10-CM | POA: Diagnosis not present

## 2018-04-27 DIAGNOSIS — H6691 Otitis media, unspecified, right ear: Secondary | ICD-10-CM | POA: Diagnosis not present

## 2018-05-05 DIAGNOSIS — R300 Dysuria: Secondary | ICD-10-CM | POA: Diagnosis not present

## 2018-05-05 DIAGNOSIS — N390 Urinary tract infection, site not specified: Secondary | ICD-10-CM | POA: Diagnosis not present

## 2018-06-19 DIAGNOSIS — Z23 Encounter for immunization: Secondary | ICD-10-CM | POA: Diagnosis not present

## 2018-08-02 DIAGNOSIS — H25013 Cortical age-related cataract, bilateral: Secondary | ICD-10-CM | POA: Diagnosis not present

## 2018-08-02 DIAGNOSIS — H40013 Open angle with borderline findings, low risk, bilateral: Secondary | ICD-10-CM | POA: Diagnosis not present

## 2018-08-02 DIAGNOSIS — H2513 Age-related nuclear cataract, bilateral: Secondary | ICD-10-CM | POA: Diagnosis not present

## 2018-08-02 DIAGNOSIS — H43393 Other vitreous opacities, bilateral: Secondary | ICD-10-CM | POA: Diagnosis not present

## 2018-08-23 DIAGNOSIS — Z1231 Encounter for screening mammogram for malignant neoplasm of breast: Secondary | ICD-10-CM | POA: Diagnosis not present

## 2019-02-01 DIAGNOSIS — Z85828 Personal history of other malignant neoplasm of skin: Secondary | ICD-10-CM | POA: Diagnosis not present

## 2019-02-01 DIAGNOSIS — D2272 Melanocytic nevi of left lower limb, including hip: Secondary | ICD-10-CM | POA: Diagnosis not present

## 2019-02-01 DIAGNOSIS — D2262 Melanocytic nevi of left upper limb, including shoulder: Secondary | ICD-10-CM | POA: Diagnosis not present

## 2019-02-01 DIAGNOSIS — D224 Melanocytic nevi of scalp and neck: Secondary | ICD-10-CM | POA: Diagnosis not present

## 2019-02-01 DIAGNOSIS — L814 Other melanin hyperpigmentation: Secondary | ICD-10-CM | POA: Diagnosis not present

## 2019-02-01 DIAGNOSIS — L570 Actinic keratosis: Secondary | ICD-10-CM | POA: Diagnosis not present

## 2019-02-01 DIAGNOSIS — L821 Other seborrheic keratosis: Secondary | ICD-10-CM | POA: Diagnosis not present

## 2019-02-01 DIAGNOSIS — D2271 Melanocytic nevi of right lower limb, including hip: Secondary | ICD-10-CM | POA: Diagnosis not present

## 2019-02-01 DIAGNOSIS — D1801 Hemangioma of skin and subcutaneous tissue: Secondary | ICD-10-CM | POA: Diagnosis not present

## 2019-02-01 DIAGNOSIS — L918 Other hypertrophic disorders of the skin: Secondary | ICD-10-CM | POA: Diagnosis not present

## 2019-02-01 DIAGNOSIS — D225 Melanocytic nevi of trunk: Secondary | ICD-10-CM | POA: Diagnosis not present

## 2019-04-02 DIAGNOSIS — H43393 Other vitreous opacities, bilateral: Secondary | ICD-10-CM | POA: Diagnosis not present

## 2019-04-02 DIAGNOSIS — H2513 Age-related nuclear cataract, bilateral: Secondary | ICD-10-CM | POA: Diagnosis not present

## 2019-04-02 DIAGNOSIS — H25013 Cortical age-related cataract, bilateral: Secondary | ICD-10-CM | POA: Diagnosis not present

## 2019-04-02 DIAGNOSIS — H2512 Age-related nuclear cataract, left eye: Secondary | ICD-10-CM | POA: Diagnosis not present

## 2019-04-02 DIAGNOSIS — H40013 Open angle with borderline findings, low risk, bilateral: Secondary | ICD-10-CM | POA: Diagnosis not present

## 2019-12-17 DIAGNOSIS — H04123 Dry eye syndrome of bilateral lacrimal glands: Secondary | ICD-10-CM | POA: Diagnosis not present

## 2019-12-17 DIAGNOSIS — H25013 Cortical age-related cataract, bilateral: Secondary | ICD-10-CM | POA: Diagnosis not present

## 2019-12-17 DIAGNOSIS — H40013 Open angle with borderline findings, low risk, bilateral: Secondary | ICD-10-CM | POA: Diagnosis not present

## 2019-12-17 DIAGNOSIS — H2512 Age-related nuclear cataract, left eye: Secondary | ICD-10-CM | POA: Diagnosis not present

## 2019-12-17 DIAGNOSIS — H2513 Age-related nuclear cataract, bilateral: Secondary | ICD-10-CM | POA: Diagnosis not present

## 2020-01-22 DIAGNOSIS — H2513 Age-related nuclear cataract, bilateral: Secondary | ICD-10-CM | POA: Diagnosis not present

## 2020-01-22 DIAGNOSIS — H25013 Cortical age-related cataract, bilateral: Secondary | ICD-10-CM | POA: Diagnosis not present

## 2020-01-22 DIAGNOSIS — H25812 Combined forms of age-related cataract, left eye: Secondary | ICD-10-CM | POA: Diagnosis not present

## 2020-01-22 DIAGNOSIS — H5712 Ocular pain, left eye: Secondary | ICD-10-CM | POA: Diagnosis not present

## 2020-02-13 DIAGNOSIS — H25011 Cortical age-related cataract, right eye: Secondary | ICD-10-CM | POA: Diagnosis not present

## 2020-02-13 DIAGNOSIS — H2511 Age-related nuclear cataract, right eye: Secondary | ICD-10-CM | POA: Diagnosis not present

## 2020-02-19 DIAGNOSIS — H2511 Age-related nuclear cataract, right eye: Secondary | ICD-10-CM | POA: Diagnosis not present

## 2020-02-19 DIAGNOSIS — H25811 Combined forms of age-related cataract, right eye: Secondary | ICD-10-CM | POA: Diagnosis not present

## 2020-02-19 DIAGNOSIS — H5711 Ocular pain, right eye: Secondary | ICD-10-CM | POA: Diagnosis not present

## 2020-02-19 DIAGNOSIS — H25011 Cortical age-related cataract, right eye: Secondary | ICD-10-CM | POA: Diagnosis not present

## 2020-03-27 DIAGNOSIS — I8310 Varicose veins of unspecified lower extremity with inflammation: Secondary | ICD-10-CM | POA: Diagnosis not present

## 2020-03-27 DIAGNOSIS — Z85828 Personal history of other malignant neoplasm of skin: Secondary | ICD-10-CM | POA: Diagnosis not present

## 2020-04-10 DIAGNOSIS — D485 Neoplasm of uncertain behavior of skin: Secondary | ICD-10-CM | POA: Diagnosis not present

## 2020-04-10 DIAGNOSIS — D2262 Melanocytic nevi of left upper limb, including shoulder: Secondary | ICD-10-CM | POA: Diagnosis not present

## 2020-04-10 DIAGNOSIS — D2271 Melanocytic nevi of right lower limb, including hip: Secondary | ICD-10-CM | POA: Diagnosis not present

## 2020-04-10 DIAGNOSIS — D225 Melanocytic nevi of trunk: Secondary | ICD-10-CM | POA: Diagnosis not present

## 2020-04-10 DIAGNOSIS — Z85828 Personal history of other malignant neoplasm of skin: Secondary | ICD-10-CM | POA: Diagnosis not present

## 2020-04-10 DIAGNOSIS — L821 Other seborrheic keratosis: Secondary | ICD-10-CM | POA: Diagnosis not present

## 2020-04-10 DIAGNOSIS — D2272 Melanocytic nevi of left lower limb, including hip: Secondary | ICD-10-CM | POA: Diagnosis not present

## 2020-04-10 DIAGNOSIS — D1801 Hemangioma of skin and subcutaneous tissue: Secondary | ICD-10-CM | POA: Diagnosis not present

## 2020-04-10 DIAGNOSIS — L570 Actinic keratosis: Secondary | ICD-10-CM | POA: Diagnosis not present

## 2020-04-10 DIAGNOSIS — L814 Other melanin hyperpigmentation: Secondary | ICD-10-CM | POA: Diagnosis not present

## 2020-04-18 DIAGNOSIS — Z20822 Contact with and (suspected) exposure to covid-19: Secondary | ICD-10-CM | POA: Diagnosis not present

## 2020-04-21 DIAGNOSIS — J029 Acute pharyngitis, unspecified: Secondary | ICD-10-CM | POA: Diagnosis not present

## 2020-05-22 DIAGNOSIS — H40013 Open angle with borderline findings, low risk, bilateral: Secondary | ICD-10-CM | POA: Diagnosis not present

## 2020-05-22 DIAGNOSIS — H04123 Dry eye syndrome of bilateral lacrimal glands: Secondary | ICD-10-CM | POA: Diagnosis not present

## 2020-06-11 DIAGNOSIS — Z85828 Personal history of other malignant neoplasm of skin: Secondary | ICD-10-CM | POA: Diagnosis not present

## 2020-06-11 DIAGNOSIS — M793 Panniculitis, unspecified: Secondary | ICD-10-CM | POA: Diagnosis not present

## 2020-06-21 DIAGNOSIS — Z23 Encounter for immunization: Secondary | ICD-10-CM | POA: Diagnosis not present

## 2020-09-02 DIAGNOSIS — Z1231 Encounter for screening mammogram for malignant neoplasm of breast: Secondary | ICD-10-CM | POA: Diagnosis not present

## 2020-12-04 DIAGNOSIS — H9201 Otalgia, right ear: Secondary | ICD-10-CM | POA: Diagnosis not present

## 2020-12-04 DIAGNOSIS — M542 Cervicalgia: Secondary | ICD-10-CM | POA: Diagnosis not present

## 2021-01-26 DIAGNOSIS — H40013 Open angle with borderline findings, low risk, bilateral: Secondary | ICD-10-CM | POA: Diagnosis not present

## 2021-01-26 DIAGNOSIS — H1045 Other chronic allergic conjunctivitis: Secondary | ICD-10-CM | POA: Diagnosis not present

## 2021-01-26 DIAGNOSIS — H16223 Keratoconjunctivitis sicca, not specified as Sjogren's, bilateral: Secondary | ICD-10-CM | POA: Diagnosis not present

## 2021-01-26 DIAGNOSIS — H04123 Dry eye syndrome of bilateral lacrimal glands: Secondary | ICD-10-CM | POA: Diagnosis not present

## 2021-04-14 DIAGNOSIS — U071 COVID-19: Secondary | ICD-10-CM | POA: Diagnosis not present

## 2021-06-18 DIAGNOSIS — Z23 Encounter for immunization: Secondary | ICD-10-CM | POA: Diagnosis not present

## 2021-06-23 DIAGNOSIS — H04123 Dry eye syndrome of bilateral lacrimal glands: Secondary | ICD-10-CM | POA: Diagnosis not present

## 2021-06-23 DIAGNOSIS — H40013 Open angle with borderline findings, low risk, bilateral: Secondary | ICD-10-CM | POA: Diagnosis not present

## 2021-07-22 DIAGNOSIS — D1801 Hemangioma of skin and subcutaneous tissue: Secondary | ICD-10-CM | POA: Diagnosis not present

## 2021-07-22 DIAGNOSIS — Z85828 Personal history of other malignant neoplasm of skin: Secondary | ICD-10-CM | POA: Diagnosis not present

## 2021-07-22 DIAGNOSIS — L918 Other hypertrophic disorders of the skin: Secondary | ICD-10-CM | POA: Diagnosis not present

## 2021-07-22 DIAGNOSIS — L821 Other seborrheic keratosis: Secondary | ICD-10-CM | POA: Diagnosis not present

## 2021-07-22 DIAGNOSIS — L814 Other melanin hyperpigmentation: Secondary | ICD-10-CM | POA: Diagnosis not present

## 2021-07-22 DIAGNOSIS — D225 Melanocytic nevi of trunk: Secondary | ICD-10-CM | POA: Diagnosis not present

## 2021-07-22 DIAGNOSIS — D2271 Melanocytic nevi of right lower limb, including hip: Secondary | ICD-10-CM | POA: Diagnosis not present

## 2021-07-22 DIAGNOSIS — D692 Other nonthrombocytopenic purpura: Secondary | ICD-10-CM | POA: Diagnosis not present

## 2021-07-22 DIAGNOSIS — D485 Neoplasm of uncertain behavior of skin: Secondary | ICD-10-CM | POA: Diagnosis not present

## 2021-07-22 DIAGNOSIS — D2262 Melanocytic nevi of left upper limb, including shoulder: Secondary | ICD-10-CM | POA: Diagnosis not present

## 2021-07-22 DIAGNOSIS — D2272 Melanocytic nevi of left lower limb, including hip: Secondary | ICD-10-CM | POA: Diagnosis not present

## 2021-11-19 DIAGNOSIS — Z1231 Encounter for screening mammogram for malignant neoplasm of breast: Secondary | ICD-10-CM | POA: Diagnosis not present

## 2022-07-15 DIAGNOSIS — H04123 Dry eye syndrome of bilateral lacrimal glands: Secondary | ICD-10-CM | POA: Diagnosis not present

## 2022-07-15 DIAGNOSIS — H16223 Keratoconjunctivitis sicca, not specified as Sjogren's, bilateral: Secondary | ICD-10-CM | POA: Diagnosis not present

## 2022-07-15 DIAGNOSIS — H35033 Hypertensive retinopathy, bilateral: Secondary | ICD-10-CM | POA: Diagnosis not present

## 2022-07-15 DIAGNOSIS — H40013 Open angle with borderline findings, low risk, bilateral: Secondary | ICD-10-CM | POA: Diagnosis not present

## 2022-08-04 DIAGNOSIS — L718 Other rosacea: Secondary | ICD-10-CM | POA: Diagnosis not present

## 2022-08-04 DIAGNOSIS — M793 Panniculitis, unspecified: Secondary | ICD-10-CM | POA: Diagnosis not present

## 2022-08-04 DIAGNOSIS — Z85828 Personal history of other malignant neoplasm of skin: Secondary | ICD-10-CM | POA: Diagnosis not present

## 2022-08-04 DIAGNOSIS — D1801 Hemangioma of skin and subcutaneous tissue: Secondary | ICD-10-CM | POA: Diagnosis not present

## 2022-08-04 DIAGNOSIS — D2262 Melanocytic nevi of left upper limb, including shoulder: Secondary | ICD-10-CM | POA: Diagnosis not present

## 2022-08-04 DIAGNOSIS — L304 Erythema intertrigo: Secondary | ICD-10-CM | POA: Diagnosis not present

## 2022-08-04 DIAGNOSIS — L821 Other seborrheic keratosis: Secondary | ICD-10-CM | POA: Diagnosis not present

## 2022-08-04 DIAGNOSIS — L905 Scar conditions and fibrosis of skin: Secondary | ICD-10-CM | POA: Diagnosis not present

## 2022-08-04 DIAGNOSIS — D225 Melanocytic nevi of trunk: Secondary | ICD-10-CM | POA: Diagnosis not present

## 2022-08-04 DIAGNOSIS — L814 Other melanin hyperpigmentation: Secondary | ICD-10-CM | POA: Diagnosis not present

## 2022-08-04 DIAGNOSIS — L578 Other skin changes due to chronic exposure to nonionizing radiation: Secondary | ICD-10-CM | POA: Diagnosis not present

## 2022-09-14 DIAGNOSIS — H04123 Dry eye syndrome of bilateral lacrimal glands: Secondary | ICD-10-CM | POA: Diagnosis not present

## 2022-09-14 DIAGNOSIS — H1045 Other chronic allergic conjunctivitis: Secondary | ICD-10-CM | POA: Diagnosis not present

## 2022-09-14 DIAGNOSIS — H16223 Keratoconjunctivitis sicca, not specified as Sjogren's, bilateral: Secondary | ICD-10-CM | POA: Diagnosis not present

## 2022-09-14 DIAGNOSIS — H40013 Open angle with borderline findings, low risk, bilateral: Secondary | ICD-10-CM | POA: Diagnosis not present

## 2022-11-25 DIAGNOSIS — Z1231 Encounter for screening mammogram for malignant neoplasm of breast: Secondary | ICD-10-CM | POA: Diagnosis not present

## 2023-01-13 DIAGNOSIS — W19XXXA Unspecified fall, initial encounter: Secondary | ICD-10-CM | POA: Diagnosis not present

## 2023-01-13 DIAGNOSIS — M1711 Unilateral primary osteoarthritis, right knee: Secondary | ICD-10-CM | POA: Diagnosis not present

## 2023-01-13 DIAGNOSIS — M25561 Pain in right knee: Secondary | ICD-10-CM | POA: Diagnosis not present

## 2023-02-25 DIAGNOSIS — N390 Urinary tract infection, site not specified: Secondary | ICD-10-CM | POA: Diagnosis not present

## 2023-04-11 DIAGNOSIS — H40013 Open angle with borderline findings, low risk, bilateral: Secondary | ICD-10-CM | POA: Diagnosis not present

## 2023-04-11 DIAGNOSIS — H26493 Other secondary cataract, bilateral: Secondary | ICD-10-CM | POA: Diagnosis not present

## 2023-04-11 DIAGNOSIS — H04123 Dry eye syndrome of bilateral lacrimal glands: Secondary | ICD-10-CM | POA: Diagnosis not present

## 2023-04-11 DIAGNOSIS — H353112 Nonexudative age-related macular degeneration, right eye, intermediate dry stage: Secondary | ICD-10-CM | POA: Diagnosis not present

## 2023-04-18 DIAGNOSIS — N390 Urinary tract infection, site not specified: Secondary | ICD-10-CM | POA: Diagnosis not present

## 2023-07-02 DIAGNOSIS — Z23 Encounter for immunization: Secondary | ICD-10-CM | POA: Diagnosis not present

## 2023-10-03 DIAGNOSIS — Z85828 Personal history of other malignant neoplasm of skin: Secondary | ICD-10-CM | POA: Diagnosis not present

## 2023-10-03 DIAGNOSIS — L814 Other melanin hyperpigmentation: Secondary | ICD-10-CM | POA: Diagnosis not present

## 2023-10-03 DIAGNOSIS — D1801 Hemangioma of skin and subcutaneous tissue: Secondary | ICD-10-CM | POA: Diagnosis not present

## 2023-10-03 DIAGNOSIS — L821 Other seborrheic keratosis: Secondary | ICD-10-CM | POA: Diagnosis not present

## 2023-10-18 DIAGNOSIS — H353112 Nonexudative age-related macular degeneration, right eye, intermediate dry stage: Secondary | ICD-10-CM | POA: Diagnosis not present

## 2023-10-18 DIAGNOSIS — H04123 Dry eye syndrome of bilateral lacrimal glands: Secondary | ICD-10-CM | POA: Diagnosis not present

## 2023-10-18 DIAGNOSIS — H40013 Open angle with borderline findings, low risk, bilateral: Secondary | ICD-10-CM | POA: Diagnosis not present

## 2023-12-01 DIAGNOSIS — Z1231 Encounter for screening mammogram for malignant neoplasm of breast: Secondary | ICD-10-CM | POA: Diagnosis not present

## 2024-01-05 DIAGNOSIS — H9201 Otalgia, right ear: Secondary | ICD-10-CM | POA: Diagnosis not present

## 2024-01-05 DIAGNOSIS — J209 Acute bronchitis, unspecified: Secondary | ICD-10-CM | POA: Diagnosis not present

## 2024-01-05 DIAGNOSIS — R051 Acute cough: Secondary | ICD-10-CM | POA: Diagnosis not present

## 2024-04-16 DIAGNOSIS — H16223 Keratoconjunctivitis sicca, not specified as Sjogren's, bilateral: Secondary | ICD-10-CM | POA: Diagnosis not present

## 2024-04-16 DIAGNOSIS — H353112 Nonexudative age-related macular degeneration, right eye, intermediate dry stage: Secondary | ICD-10-CM | POA: Diagnosis not present

## 2024-04-16 DIAGNOSIS — H40013 Open angle with borderline findings, low risk, bilateral: Secondary | ICD-10-CM | POA: Diagnosis not present

## 2024-04-16 DIAGNOSIS — H35033 Hypertensive retinopathy, bilateral: Secondary | ICD-10-CM | POA: Diagnosis not present

## 2024-04-16 DIAGNOSIS — H04123 Dry eye syndrome of bilateral lacrimal glands: Secondary | ICD-10-CM | POA: Diagnosis not present

## 2024-06-23 DIAGNOSIS — Z23 Encounter for immunization: Secondary | ICD-10-CM | POA: Diagnosis not present
